# Patient Record
Sex: Female | Born: 1940 | Race: White | Hispanic: No | Marital: Single | Smoking: Former smoker
Health system: Southern US, Community
[De-identification: ages and names within clinical notes are randomized; demographics above are authoritative.]

## PROBLEM LIST (undated history)

## (undated) DIAGNOSIS — E538 Deficiency of other specified B group vitamins: Secondary | ICD-10-CM

## (undated) DIAGNOSIS — I10 Essential (primary) hypertension: Secondary | ICD-10-CM

## (undated) DIAGNOSIS — E785 Hyperlipidemia, unspecified: Secondary | ICD-10-CM

## (undated) DIAGNOSIS — H269 Unspecified cataract: Secondary | ICD-10-CM

## (undated) DIAGNOSIS — E119 Type 2 diabetes mellitus without complications: Secondary | ICD-10-CM

## (undated) HISTORY — PX: CATARACT EXTRACTION: SUR2

## (undated) HISTORY — PX: COLONOSCOPY: SHX174

## (undated) HISTORY — PX: EYE SURGERY: SHX253

---

## 2004-11-27 ENCOUNTER — Ambulatory Visit: Payer: Self-pay | Admitting: Family Medicine

## 2006-10-25 ENCOUNTER — Emergency Department: Payer: Self-pay | Admitting: Emergency Medicine

## 2008-04-27 ENCOUNTER — Ambulatory Visit: Payer: Self-pay | Admitting: Ophthalmology

## 2008-04-27 ENCOUNTER — Other Ambulatory Visit: Payer: Self-pay

## 2008-05-11 ENCOUNTER — Ambulatory Visit: Payer: Self-pay | Admitting: Ophthalmology

## 2010-11-29 ENCOUNTER — Ambulatory Visit: Payer: Self-pay | Admitting: Family Medicine

## 2011-03-23 ENCOUNTER — Ambulatory Visit: Payer: Self-pay | Admitting: Physician Assistant

## 2011-04-12 ENCOUNTER — Emergency Department: Payer: Self-pay | Admitting: Internal Medicine

## 2011-04-18 ENCOUNTER — Encounter (HOSPITAL_COMMUNITY)
Admission: RE | Admit: 2011-04-18 | Discharge: 2011-04-18 | Disposition: A | Discharge status: Home / Self Care | Payer: Medicare Other | Source: Ambulatory Visit | Attending: Neurosurgery | Admitting: Neurosurgery

## 2011-04-18 LAB — BASIC METABOLIC PANEL
CO2: 30 mEq/L (ref 19–32)
Calcium: 9.2 mg/dL (ref 8.4–10.5)
Chloride: 105 mEq/L (ref 96–112)
Creatinine, Ser: 0.77 mg/dL (ref 0.50–1.10)
Glucose, Bld: 108 mg/dL — ABNORMAL HIGH (ref 70–99)
Sodium: 141 mEq/L (ref 135–145)

## 2011-04-18 LAB — SURGICAL PCR SCREEN: Staphylococcus aureus: NEGATIVE

## 2011-04-18 LAB — CBC
Hemoglobin: 11.9 g/dL — ABNORMAL LOW (ref 12.0–15.0)
MCH: 28.9 pg (ref 26.0–34.0)
MCV: 88.8 fL (ref 78.0–100.0)
RBC: 4.12 MIL/uL (ref 3.87–5.11)

## 2011-04-20 ENCOUNTER — Inpatient Hospital Stay (HOSPITAL_COMMUNITY)
Admission: RE | Admit: 2011-04-20 | Discharge: 2011-04-21 | DRG: 473 | Disposition: A | Discharge status: Home / Self Care | Payer: Medicare Other | Source: Ambulatory Visit | Attending: Neurosurgery | Admitting: Neurosurgery

## 2011-04-20 DIAGNOSIS — F329 Major depressive disorder, single episode, unspecified: Secondary | ICD-10-CM | POA: Diagnosis present

## 2011-04-20 DIAGNOSIS — M129 Arthropathy, unspecified: Secondary | ICD-10-CM | POA: Diagnosis present

## 2011-04-20 DIAGNOSIS — Z01812 Encounter for preprocedural laboratory examination: Secondary | ICD-10-CM

## 2011-04-20 DIAGNOSIS — E119 Type 2 diabetes mellitus without complications: Secondary | ICD-10-CM | POA: Diagnosis present

## 2011-04-20 DIAGNOSIS — M5020 Other cervical disc displacement, unspecified cervical region: Principal | ICD-10-CM | POA: Diagnosis present

## 2011-04-20 DIAGNOSIS — I10 Essential (primary) hypertension: Secondary | ICD-10-CM | POA: Diagnosis present

## 2011-04-20 DIAGNOSIS — F3289 Other specified depressive episodes: Secondary | ICD-10-CM | POA: Diagnosis present

## 2011-04-20 LAB — GLUCOSE, CAPILLARY: Glucose-Capillary: 125 mg/dL — ABNORMAL HIGH (ref 70–99)

## 2011-04-21 LAB — GLUCOSE, CAPILLARY

## 2011-04-26 NOTE — Discharge Summary (Signed)
  April [name], April [name]                 ACCOUNT NO.:  [id number]  MEDICAL RECORD NO.:  [id number]  LOCATION:  3528                         FACILITY:  [facility]  PHYSICIAN:  Stefani Dama, M.D.  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  DISCHARGE AND FINAL DIAGNOSES: 1. C6 radiculopathy secondary to herniated nucleus pulposus, C5-C6. 2. Diabetes mellitus.  CONDITION ON DISCHARGE:  Improving.  HOSPITAL COURSE:  [name] is a 70 year old individual who had severe cervical radiculopathy that was unresponsive to conservative management.  On April 20, 2011, she underwent anterior cervical decompression and fusion at C5-C6 with anterior plate fixation. Postoperatively, the patient has had minimal difficulty with swallowing. Her diabetes has been readily manageable, and she is discharged home on her home medications.  In addition, she is given a prescription for Percocet #60 without refills, Flexeril 10 mg #60 also without refills to be used for pain control.  Her incision is clean and dry.  Her neurologic function is within limits of normal, and her condition on discharge is improving.  The patient will be discharged at the current time for followup as an outpatient.     Stefani Dama, M.D.     Merla Riches  D:  04/21/2011  T:  04/21/2011  Job:  [id number]  Electronically Signed by Barnett Abu M.D. on 04/26/2011 05:24:00 PM

## 2011-05-01 ENCOUNTER — Ambulatory Visit: Payer: Self-pay | Admitting: Family Medicine

## 2011-05-11 NOTE — Op Note (Signed)
NAMEJAYLENN, April [name]                 ACCOUNT NO.:  [id number]  MEDICAL RECORD NO.:  [id number]  LOCATION:  3528                         FACILITY:  [facility]  PHYSICIAN:  Donalee Citrin, M.D.        DATE OF BIRTH:  [birth year]  DATE OF PROCEDURE:  04/20/2011 DATE OF DISCHARGE:                              OPERATIVE REPORT   PREOPERATIVE DIAGNOSIS:  Left C6 radiculopathy from ruptured disk at C5- C6.  PROCEDURE:  Intracervical diskectomy and fusion at C5-C6 using Globus PEEK cage and Globus Addition plating system.  SURGEON:  Donalee Citrin, MD  ANESTHESIA:  General endotracheal.  HISTORY OF PRESENT ILLNESS:  The patient is a very pleasant 70 year old female who has had progressive worsening neck and left arm pain with weakness in her biceps, triceps, and numbness and tingling in his C6 distribution.  MRI scan showed an acute disk herniation with a free fragment disk space in the left C6 nerve root.  The patient failed all forms of conservative treatment and due to her clinical exam and MRI findings, he was recommended anterior cervical diskectomy and fusion.  I went over the risks and benefits of the operation to her.  She understands and agreed to proceed forward.  PROCEDURE IN DETAIL:  The patient was brought to the OR, was induced under general anesthesia, positioned supine with the neck in slight extension and 5 pounds of Halter traction.  The right side of her neck was prepped and draped in usual sterile fashion.  After preoperative x- ray localized the appropriate level, a curvilinear incision was made just off the midline to the anterior border of the sternocleidomastoid. The superficial layer of the platysma was divided out and dissected longitudinally.  The avascular plane between the sternocleidomastoid and strap muscles was developed down to the prevertebral fascia. Prevertebral fascia was dissected with Kittners.  Intraoperative x-ray identified the disk space above this at  C4-C5, so annulotomy was made one disk space below at C5-C6.  Large anterior osteophytes were bitten off with a 3-mm Kerrison punch.  Disk space was drilled down, capturing the bone shavings in mucus trap and at this point, the operative microscope was draped, brought onto the field, and under microscopic illumination, the undersurface of both endplates was underbitten.  The PLL was identified and was removed in piecemeal fashion.  Then marching across the left side with a black nerve hook, several large fragments of disk were removed from the C6 foramen.  Further underbiting of both endplates and the uncinate allowed identification of the C6 pedicle and the C6 nerve root was then further identified, skeletonized, flushed with pedicle after all the additional fragments had been removed from the foramen.  Then, the right side was decompressed.  After adequate decompression had been achieved both centrally and biforaminally, endplates were scraped.  An 89-mm PEEK cage was selected, packed with autograft mixed with Actifuse and then a 40-mm Globus plate was placed. All screws had excellent purchase.  Locking mechanisms were engaged. The wound was then copiously irrigated.  Meticulous hemostasis was maintained.  The platysma was then reapproximated with interrupted Vicryl and the skin was closed with running 4-0 subcuticular.  Benzoin  and Steri-Strips were applied.  The patient went to recovery room in stable condition.  At the end of the case, sponge and instruments count were correct.          ______________________________ Donalee Citrin, M.D.     GC/MEDQ  D:  04/20/2011  T:  04/21/2011  Job:  [id number]  Electronically Signed by Donalee Citrin M.D. on 05/11/2011 07:25:09 AM

## 2011-05-17 ENCOUNTER — Other Ambulatory Visit (HOSPITAL_COMMUNITY): Payer: Self-pay | Admitting: Neurosurgery

## 2011-05-17 ENCOUNTER — Ambulatory Visit (HOSPITAL_COMMUNITY)
Admission: RE | Admit: 2011-05-17 | Discharge: 2011-05-17 | Disposition: A | Discharge status: Home / Self Care | Payer: Medicare Other | Source: Ambulatory Visit | Attending: Neurosurgery | Admitting: Neurosurgery

## 2011-05-17 DIAGNOSIS — M542 Cervicalgia: Secondary | ICD-10-CM | POA: Insufficient documentation

## 2011-05-17 DIAGNOSIS — R52 Pain, unspecified: Secondary | ICD-10-CM

## 2011-05-17 DIAGNOSIS — Z981 Arthrodesis status: Secondary | ICD-10-CM | POA: Insufficient documentation

## 2011-05-29 ENCOUNTER — Ambulatory Visit
Admission: RE | Admit: 2011-05-29 | Discharge: 2011-05-29 | Disposition: A | Discharge status: Home / Self Care | Payer: Medicare Other | Source: Ambulatory Visit | Attending: Neurosurgery | Admitting: Neurosurgery

## 2011-05-29 ENCOUNTER — Other Ambulatory Visit: Payer: Self-pay | Admitting: Neurosurgery

## 2011-05-29 DIAGNOSIS — M542 Cervicalgia: Secondary | ICD-10-CM

## 2011-05-29 DIAGNOSIS — M5030 Other cervical disc degeneration, unspecified cervical region: Secondary | ICD-10-CM

## 2011-05-29 DIAGNOSIS — M79609 Pain in unspecified limb: Secondary | ICD-10-CM

## 2011-07-12 ENCOUNTER — Ambulatory Visit
Admission: RE | Admit: 2011-07-12 | Discharge: 2011-07-12 | Disposition: A | Discharge status: Home / Self Care | Payer: Medicare Other | Source: Ambulatory Visit | Attending: Neurosurgery | Admitting: Neurosurgery

## 2011-07-12 ENCOUNTER — Other Ambulatory Visit: Payer: Self-pay | Admitting: Neurosurgery

## 2011-07-12 DIAGNOSIS — M542 Cervicalgia: Secondary | ICD-10-CM

## 2011-07-19 IMAGING — CT CT CHEST W/ CM
1 series · 16 of 31 positions shown, 20 images · IV contrast (agent unspecified)
Comparison: none

REASON FOR EXAM: lung nodule  seen on Xray  takes Metformin
COMMENTS:

PROCEDURE:     CT  - CT CHEST WITH CONTRAST  - May 01, 2011  [DATE]
RESULT:
HISTORY: Lung nodule.

[Series 2: soft tissue · axial · 0.70mm/px · z∈[-692,-412]mm · 16 of 62 slices shown, 20 images]
[im 3/62  mediastinal]
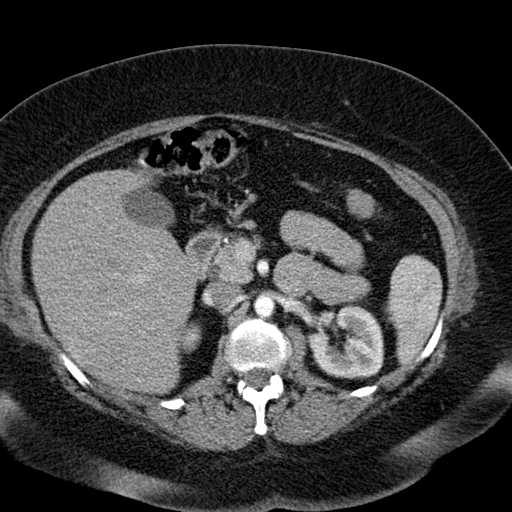
[im 3/62  lung]
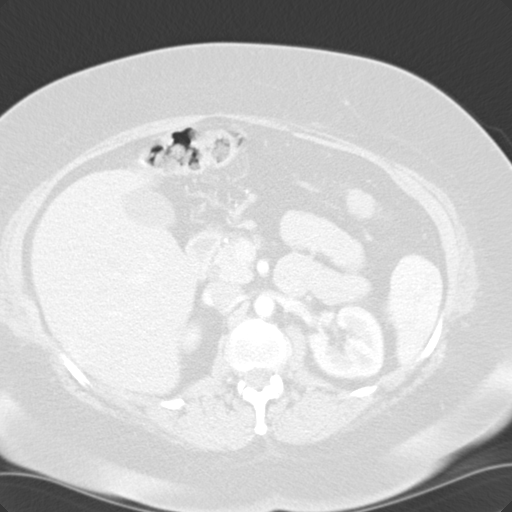
[im 7/62  lung]
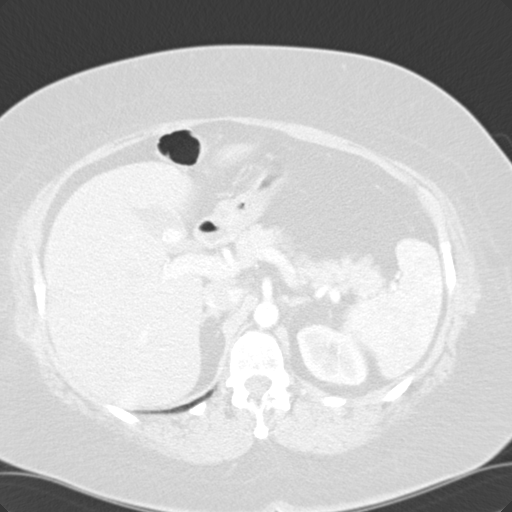
[im 12/62  lung]
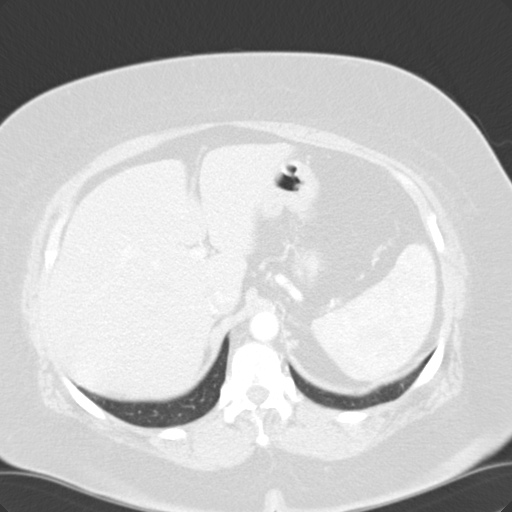
[im 14/62  lung]
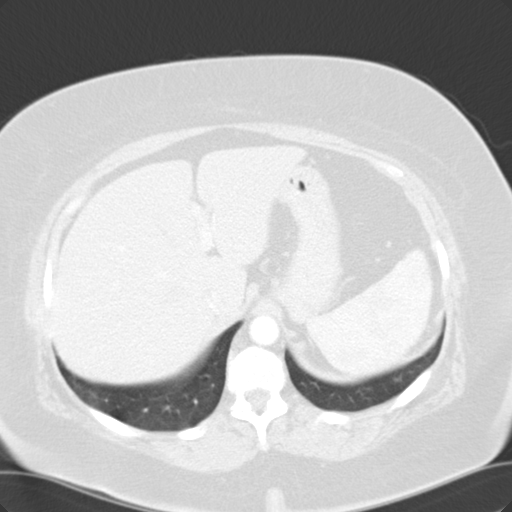
[im 19/62  mediastinal]
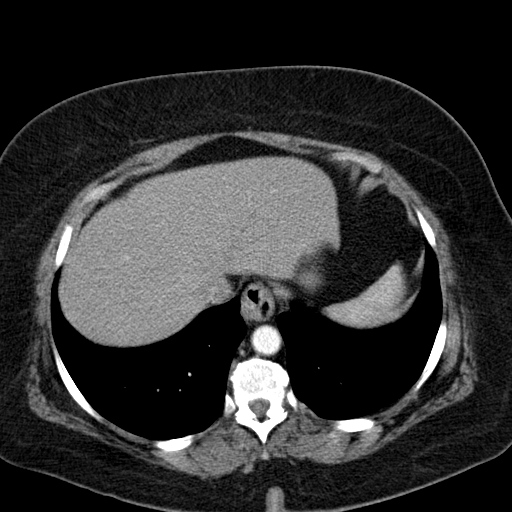
[im 19/62  lung]
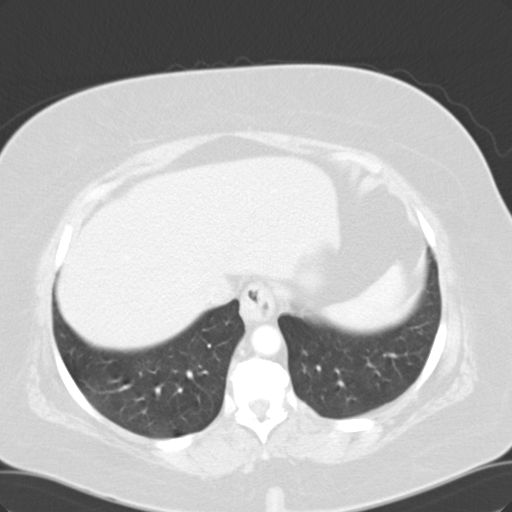
[im 21/62  lung]
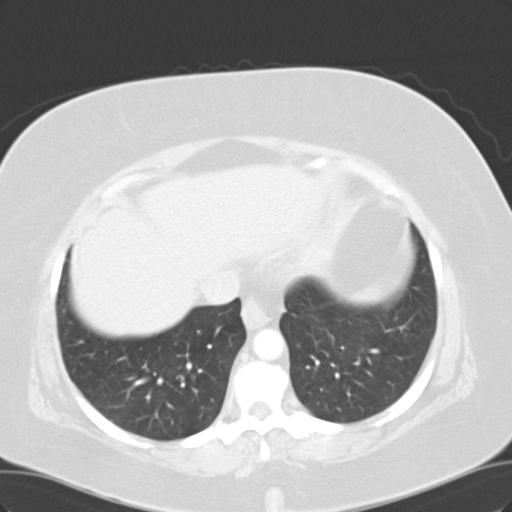
[im 25/62  lung]
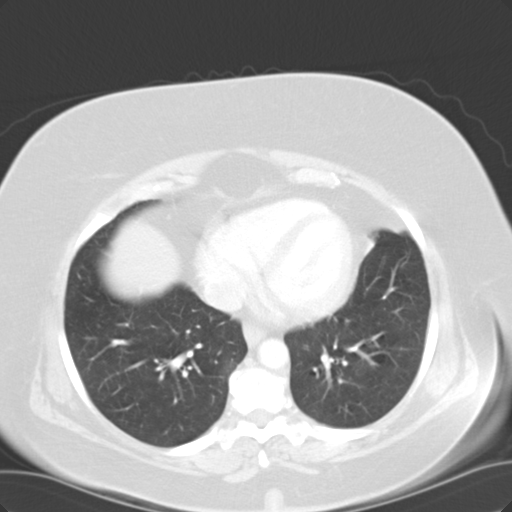
[im 30/62  lung]
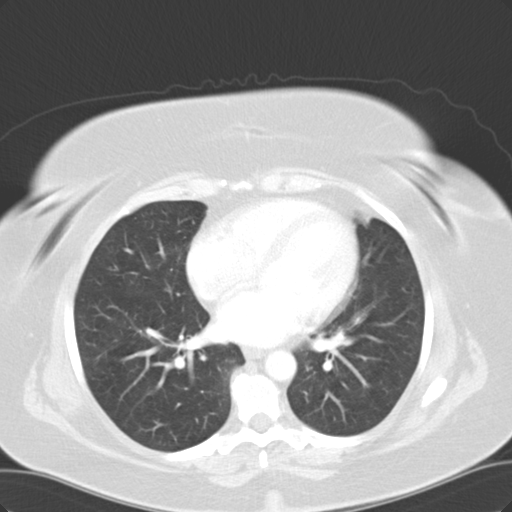
[im 33/62  mediastinal]
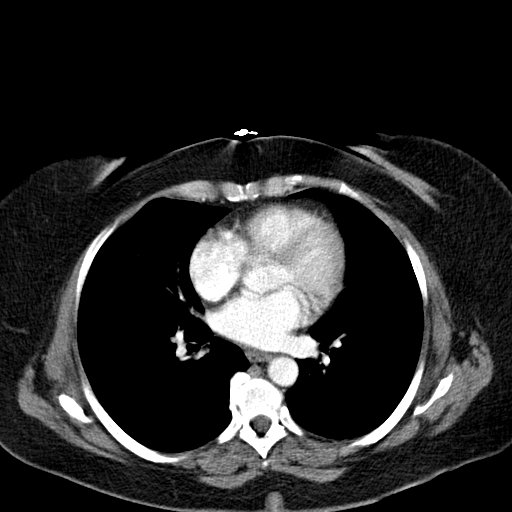
[im 33/62  lung]
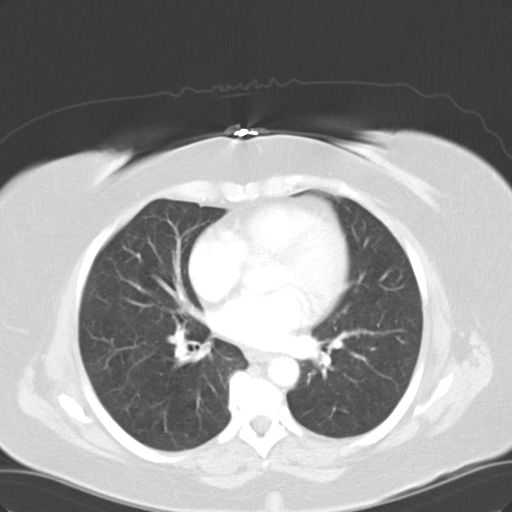
[im 37/62  lung]
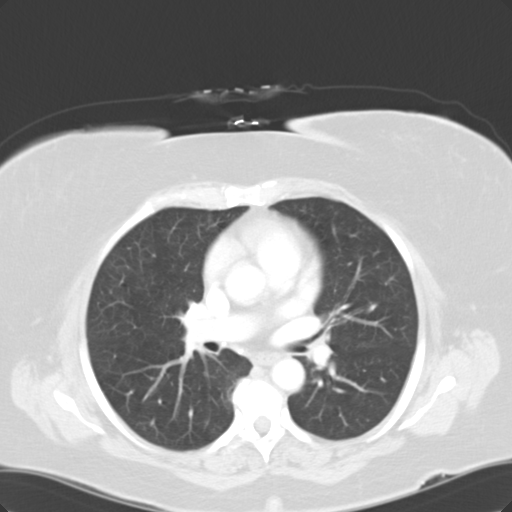
[im 41/62  lung]
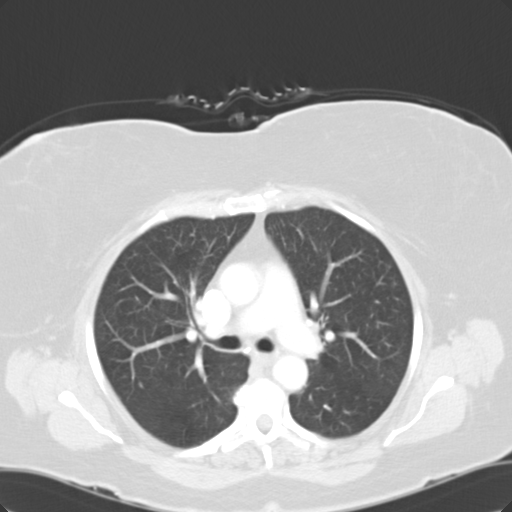
[im 43/62  lung]
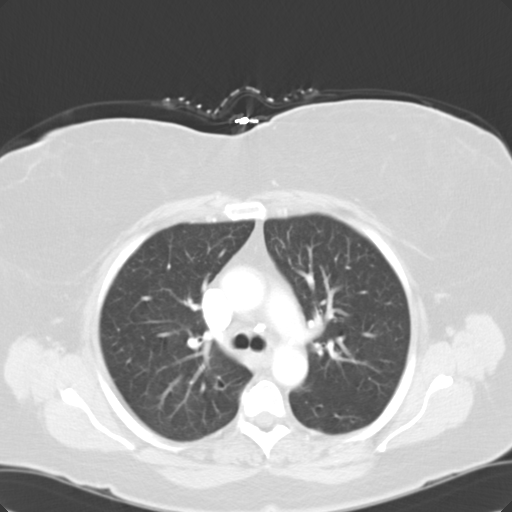
[im 48/62  mediastinal]
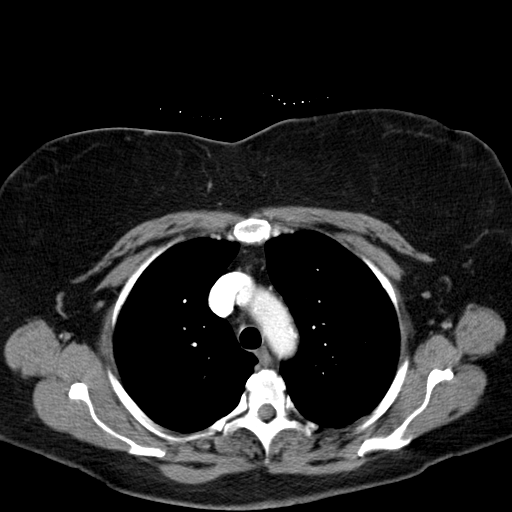
[im 48/62  lung]
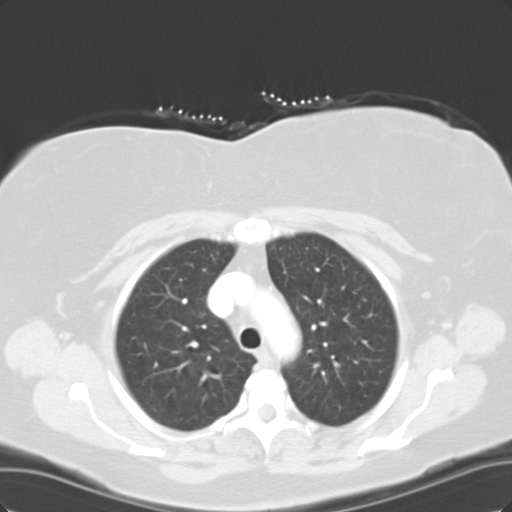
[im 50/62  lung]
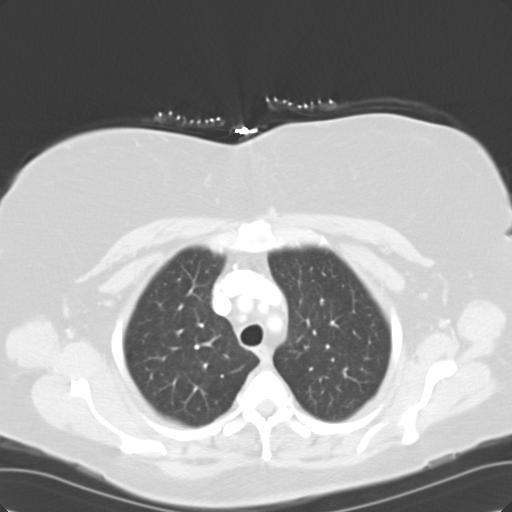
[im 55/62  lung]
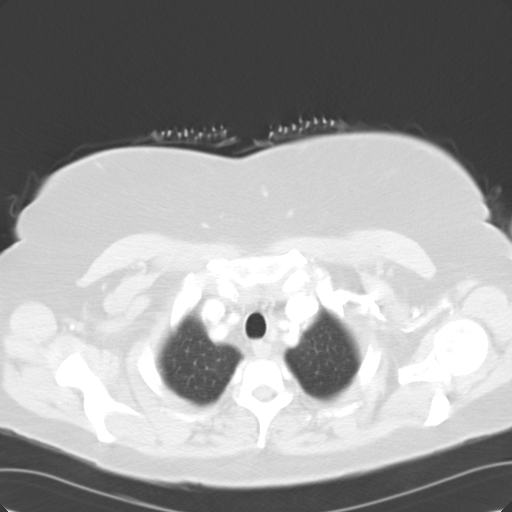
[im 59/62  lung]
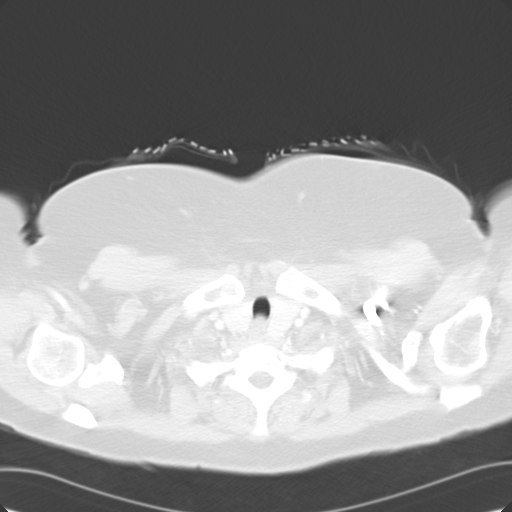

[16 of 31 positions shown; findings below may reference images not displayed]

FINDINGS: Standard contrast enhanced CT scan obtained with 75 ml of
Ysovue-DBF. Mitral annular calcification is present. Calcified pulmonary
nodule noted in the right lung base most consistent with a granuloma. The
adrenals are normal. No pulmonary infiltrates noted. No acute bony
abnormality identified.
IMPRESSION: Calcified pulmonary nodule in the right lung base most
consistent with a granuloma.

## 2012-09-24 ENCOUNTER — Ambulatory Visit: Payer: Self-pay | Admitting: Family Medicine

## 2014-03-26 DIAGNOSIS — E119 Type 2 diabetes mellitus without complications: Secondary | ICD-10-CM

## 2014-03-26 DIAGNOSIS — I10 Essential (primary) hypertension: Secondary | ICD-10-CM | POA: Diagnosis present

## 2014-03-26 DIAGNOSIS — E7849 Other hyperlipidemia: Secondary | ICD-10-CM | POA: Diagnosis present

## 2015-04-06 ENCOUNTER — Other Ambulatory Visit: Payer: Self-pay | Admitting: Family Medicine

## 2015-04-06 DIAGNOSIS — Z1231 Encounter for screening mammogram for malignant neoplasm of breast: Secondary | ICD-10-CM

## 2015-04-20 ENCOUNTER — Other Ambulatory Visit: Payer: Self-pay | Admitting: Family Medicine

## 2015-04-20 ENCOUNTER — Ambulatory Visit
Admission: RE | Admit: 2015-04-20 | Discharge: 2015-04-20 | Disposition: A | Discharge status: Home / Self Care | Payer: Medicare Other | Source: Ambulatory Visit | Attending: Family Medicine | Admitting: Family Medicine

## 2015-04-20 DIAGNOSIS — Z1231 Encounter for screening mammogram for malignant neoplasm of breast: Secondary | ICD-10-CM | POA: Diagnosis present

## 2015-07-01 ENCOUNTER — Encounter: Payer: Self-pay | Admitting: *Deleted

## 2015-07-02 DIAGNOSIS — I10 Essential (primary) hypertension: Secondary | ICD-10-CM | POA: Diagnosis not present

## 2015-07-02 DIAGNOSIS — Z800 Family history of malignant neoplasm of digestive organs: Secondary | ICD-10-CM | POA: Diagnosis not present

## 2015-07-02 DIAGNOSIS — E119 Type 2 diabetes mellitus without complications: Secondary | ICD-10-CM | POA: Diagnosis not present

## 2015-07-02 DIAGNOSIS — K641 Second degree hemorrhoids: Secondary | ICD-10-CM | POA: Diagnosis not present

## 2015-07-02 DIAGNOSIS — K644 Residual hemorrhoidal skin tags: Secondary | ICD-10-CM | POA: Diagnosis not present

## 2015-07-02 DIAGNOSIS — Z87891 Personal history of nicotine dependence: Secondary | ICD-10-CM | POA: Diagnosis not present

## 2015-07-02 DIAGNOSIS — Z888 Allergy status to other drugs, medicaments and biological substances status: Secondary | ICD-10-CM | POA: Diagnosis not present

## 2015-07-02 DIAGNOSIS — Z882 Allergy status to sulfonamides status: Secondary | ICD-10-CM | POA: Diagnosis not present

## 2015-07-02 DIAGNOSIS — E538 Deficiency of other specified B group vitamins: Secondary | ICD-10-CM | POA: Diagnosis not present

## 2015-07-02 DIAGNOSIS — K5730 Diverticulosis of large intestine without perforation or abscess without bleeding: Secondary | ICD-10-CM | POA: Diagnosis not present

## 2015-07-02 DIAGNOSIS — E785 Hyperlipidemia, unspecified: Secondary | ICD-10-CM | POA: Diagnosis not present

## 2015-07-02 DIAGNOSIS — D1779 Benign lipomatous neoplasm of other sites: Secondary | ICD-10-CM | POA: Diagnosis not present

## 2015-07-02 DIAGNOSIS — Z1211 Encounter for screening for malignant neoplasm of colon: Secondary | ICD-10-CM | POA: Diagnosis not present

## 2015-07-04 ENCOUNTER — Encounter
Admission: RE | Disposition: A | Discharge status: Home / Self Care | Payer: Self-pay | Source: Ambulatory Visit | Attending: Gastroenterology

## 2015-07-04 ENCOUNTER — Ambulatory Visit
Admission: RE | Admit: 2015-07-04 | Discharge: 2015-07-04 | Disposition: A | Discharge status: Home / Self Care | Payer: Medicare Other | Source: Ambulatory Visit | Attending: Gastroenterology | Admitting: Gastroenterology

## 2015-07-04 ENCOUNTER — Encounter: Payer: Self-pay | Admitting: Anesthesiology

## 2015-07-04 ENCOUNTER — Ambulatory Visit: Payer: Medicare Other | Admitting: Anesthesiology

## 2015-07-04 DIAGNOSIS — Z888 Allergy status to other drugs, medicaments and biological substances status: Secondary | ICD-10-CM | POA: Insufficient documentation

## 2015-07-04 DIAGNOSIS — E538 Deficiency of other specified B group vitamins: Secondary | ICD-10-CM | POA: Insufficient documentation

## 2015-07-04 DIAGNOSIS — E119 Type 2 diabetes mellitus without complications: Secondary | ICD-10-CM | POA: Insufficient documentation

## 2015-07-04 DIAGNOSIS — D1779 Benign lipomatous neoplasm of other sites: Secondary | ICD-10-CM | POA: Insufficient documentation

## 2015-07-04 DIAGNOSIS — K5730 Diverticulosis of large intestine without perforation or abscess without bleeding: Secondary | ICD-10-CM | POA: Insufficient documentation

## 2015-07-04 DIAGNOSIS — Z1211 Encounter for screening for malignant neoplasm of colon: Secondary | ICD-10-CM | POA: Insufficient documentation

## 2015-07-04 DIAGNOSIS — Z882 Allergy status to sulfonamides status: Secondary | ICD-10-CM | POA: Insufficient documentation

## 2015-07-04 DIAGNOSIS — I10 Essential (primary) hypertension: Secondary | ICD-10-CM | POA: Insufficient documentation

## 2015-07-04 DIAGNOSIS — K644 Residual hemorrhoidal skin tags: Secondary | ICD-10-CM | POA: Insufficient documentation

## 2015-07-04 DIAGNOSIS — E785 Hyperlipidemia, unspecified: Secondary | ICD-10-CM | POA: Insufficient documentation

## 2015-07-04 DIAGNOSIS — Z800 Family history of malignant neoplasm of digestive organs: Secondary | ICD-10-CM | POA: Insufficient documentation

## 2015-07-04 DIAGNOSIS — K641 Second degree hemorrhoids: Secondary | ICD-10-CM | POA: Insufficient documentation

## 2015-07-04 DIAGNOSIS — Z87891 Personal history of nicotine dependence: Secondary | ICD-10-CM | POA: Insufficient documentation

## 2015-07-04 HISTORY — DX: Deficiency of other specified B group vitamins: E53.8

## 2015-07-04 HISTORY — DX: Essential (primary) hypertension: I10

## 2015-07-04 HISTORY — DX: Unspecified cataract: H26.9

## 2015-07-04 HISTORY — DX: Hyperlipidemia, unspecified: E78.5

## 2015-07-04 HISTORY — PX: COLONOSCOPY WITH PROPOFOL: SHX5780

## 2015-07-04 HISTORY — DX: Type 2 diabetes mellitus without complications: E11.9

## 2015-07-04 LAB — GLUCOSE, CAPILLARY: GLUCOSE-CAPILLARY: 223 mg/dL — AB (ref 65–99)

## 2015-07-04 SURGERY — COLONOSCOPY WITH PROPOFOL
Anesthesia: General

## 2015-07-04 MED ORDER — SODIUM CHLORIDE 0.9 % IV SOLN: INTRAVENOUS | Status: DC

## 2015-07-04 MED ORDER — LIDOCAINE HCL (CARDIAC) 20 MG/ML IV SOLN
INTRAVENOUS | Status: DC | PRN
  Administered 2015-07-04: 100 mg via INTRAVENOUS

## 2015-07-04 MED ORDER — PROPOFOL 10 MG/ML IV BOLUS
INTRAVENOUS | Status: DC | PRN
  Administered 2015-07-04: 80 mg via INTRAVENOUS

## 2015-07-04 MED ORDER — SODIUM CHLORIDE 0.9 % IV SOLN
INTRAVENOUS | Status: DC
  Administered 2015-07-04: 1000 mL via INTRAVENOUS

## 2015-07-04 MED ORDER — PROPOFOL INFUSION 10 MG/ML OPTIME
INTRAVENOUS | Status: DC | PRN
  Administered 2015-07-04: 120 ug/kg/min via INTRAVENOUS

## 2015-07-04 NOTE — H&P (Signed)
  Primary Care Physician:  Maryland Pink, MD  Pre-Procedure History & Physical: HPI:  April [name] is a 74 y.o. female is here for an colonoscopy.   Past Medical History  Diagnosis Date  . Diabetes mellitus without complication   . Cataracts, both eyes   . Dyslipidemia   . B12 deficiency   . Hypertension     Past Surgical History  Procedure Laterality Date  . Colonoscopy    . Eye surgery    . Cataract extraction      Prior to Admission medications   Medication Sig Start Date End Date Taking? Authorizing Provider  glipiZIDE (GLUCOTROL) 10 MG tablet Take 10 mg by mouth daily before breakfast.   Yes Historical Provider, MD  lisinopril (PRINIVIL,ZESTRIL) 5 MG tablet Take 5 mg by mouth daily.   Yes Historical Provider, MD  meloxicam (MOBIC) 15 MG tablet Take 15 mg by mouth daily.   Yes Historical Provider, MD  metFORMIN (GLUCOPHAGE) 500 MG tablet Take by mouth 2 (two) times daily with a meal.   Yes Historical Provider, MD  pravastatin (PRAVACHOL) 80 MG tablet Take 80 mg by mouth daily.   Yes Historical Provider, MD  sertraline (ZOLOFT) 50 MG tablet Take 50 mg by mouth daily.   Yes Historical Provider, MD  SitaGLIPtin-MetFORMIN HCl 50-500 MG TB24 Take by mouth.   Yes Historical Provider, MD    Allergies as of 06/14/2015  . (Not on File)    Family History  Problem Relation Age of Onset  . Breast cancer Sister     Social History   Social History  . Marital Status: Single    Spouse Name: N/A  . Number of Children: N/A  . Years of Education: N/A   Occupational History  . Not on file.   Social History Main Topics  . Smoking status: Former Research scientist (life sciences)  . Smokeless tobacco: Not on file  . Alcohol Use: Not on file  . Drug Use: Not on file  . Sexual Activity: Not on file   Other Topics Concern  . Not on file   Social History Narrative     Physical Exam: BP 143/61 mmHg  Pulse 82  Temp(Src) 98.3 F (36.8 C) (Tympanic)  Resp 16  Ht 5\' 10"  (1.778 m)  Wt 117.935 kg  (260 lb)  BMI 37.31 kg/m2  SpO2 99% General:   Alert,  pleasant and cooperative in NAD Head:  Normocephalic and atraumatic. Neck:  Supple; no masses or thyromegaly. Lungs:  Clear throughout to auscultation.    Heart:  Regular rate and rhythm. Abdomen:  Soft, nontender and nondistended. Normal bowel sounds, without guarding, and without rebound.   Neurologic:  Alert and  oriented x4;  grossly normal neurologically.  Impression/Plan: April [name] is here for an colonoscopy to be performed for screening, avg risk  Risks, benefits, limitations, and alternatives regarding  colonoscopy have been reviewed with the patient.  Questions have been answered.  All parties agreeable.   Josefine Class, MD  07/04/2015, 8:44 AM

## 2015-07-04 NOTE — Discharge Instructions (Signed)

## 2015-07-04 NOTE — Anesthesia Postprocedure Evaluation (Signed)
  Anesthesia Post-op Note  Patient: April [name]  Procedure(s) Performed: Procedure(s): COLONOSCOPY WITH PROPOFOL (N/A)  Anesthesia type:General  Patient location: PACU  Post pain: Pain level controlled  Post assessment: Post-op Vital signs reviewed, Patient's Cardiovascular Status Stable, Respiratory Function Stable, Patent Airway and No signs of Nausea or vomiting  Post vital signs: Reviewed and stable  Last Vitals:  Filed Vitals:   07/04/15 0942  BP: 142/64  Pulse: 75  Temp:   Resp: 22    Level of consciousness: awake, alert  and patient cooperative  Complications: No apparent anesthesia complications

## 2015-07-04 NOTE — Op Note (Signed)
[facility] Gastroenterology Patient Name: April [name] Procedure Date: 07/04/2015 8:39 AM MRN: [id number] Account #: [id number] Date of Birth: [birth year] Admit Type: Outpatient Age: 74 Room: [facility] ENDO ROOM 2 Gender: Female Note Status: Finalized Procedure:         Colonoscopy Indications:       Colon cancer screening in patient at increased risk:                     Colorectal cancer in multiple 2nd degree relatives, This                     is the patient's first colonoscopy Patient Profile:   This is a 74 year old female. Providers:         Gerrit Heck. Rayann Heman, MD Referring MD:      Irven Easterly. Kary Kos, MD (Referring MD) Medicines:         Propofol per Anesthesia Complications:     No immediate complications. Procedure:         Pre-Anesthesia Assessment:                    - Prior to the procedure, a History and Physical was                     performed, and patient medications, allergies and                     sensitivities were reviewed. The patient's tolerance of                     previous anesthesia was reviewed.                    After obtaining informed consent, the colonoscope was                     passed under direct vision. Throughout the procedure, the                     patient's blood pressure, pulse, and oxygen saturations                     were monitored continuously. The Colonoscope was                     introduced through the anus and advanced to the the cecum,                     identified by appendiceal orifice and ileocecal valve. The                     colonoscopy was performed without difficulty. The patient                     tolerated the procedure well. The quality of the bowel                     preparation was excellent. Findings:      The perianal exam findings include non-thrombosed external hemorrhoids.      There was a medium-sized lipoma, in the proximal transverse colon.       Pillow sign was positive.      Many small  and large-mouthed diverticula were found in the sigmoid colon.      Internal hemorrhoids were found during  retroflexion. The hemorrhoids       were Grade II (internal hemorrhoids that prolapse but reduce       spontaneously).      The exam was otherwise without abnormality. Impression:        - Non-thrombosed external hemorrhoids found on perianal                     exam.                    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                    - Diverticulosis in the sigmoid colon.                    - Internal hemorrhoids.                    - The examination was otherwise normal.                    - No specimens collected. Recommendation:    - Observe patient in GI recovery unit.                    - High fiber diet.                    - Continue present medications.                    - Repeat colonoscopy in 5 years for screening purposes.                    - The findings and recommendations were discussed with the                     patient.                    - The findings and recommendations were discussed with the                     patient's family. Procedure Code(s): --- Professional ---                    [phone], Colonoscopy, flexible; diagnostic, including                     collection of specimen(s) by brushing or washing, when                     performed (separate procedure) CPT copyright 2014 American Medical Association. All rights reserved. The codes documented in this report are preliminary and upon coder review may  be revised to meet current compliance requirements. Mellody Life, MD 07/04/2015 9:12:11 AM This report has been signed electronically. Number of Addenda: 0 Note Initiated On: 07/04/2015 8:39 AM Scope Withdrawal Time: 0 hours 9 minutes 59 seconds  Total Procedure Duration: 0 hours 13 minutes 35 seconds       [facility]

## 2015-07-04 NOTE — Anesthesia Preprocedure Evaluation (Signed)
Anesthesia Evaluation  Patient identified by MRN, date of birth, ID band Patient awake    Reviewed: Allergy & Precautions, H&P , NPO status , Patient's Chart, lab work & pertinent test results  Airway Mallampati: III  TM Distance: >3 FB Neck ROM: limited    Dental  (+) Poor Dentition, Chipped, Missing   Pulmonary former smoker,    Pulmonary exam normal breath sounds clear to auscultation       Cardiovascular Exercise Tolerance: Good hypertension, (-) Past MI Normal cardiovascular exam Rhythm:regular Rate:Normal     Neuro/Psych negative neurological ROS  negative psych ROS   GI/Hepatic negative GI ROS, Neg liver ROS,   Endo/Other  diabetes, Type 2, Oral Hypoglycemic Agents  Renal/GU negative Renal ROS  negative genitourinary   Musculoskeletal   Abdominal   Peds  Hematology negative hematology ROS (+)   Anesthesia Other Findings Past Medical History:   Diabetes mellitus without complication                       Cataracts, both eyes                                         Dyslipidemia                                                 B12 deficiency                                               Hypertension                                                 Signs and symptoms suggestive of sleep apnea    Reproductive/Obstetrics negative OB ROS                             Anesthesia Physical Anesthesia Plan  ASA: III  Anesthesia Plan: General   Post-op Pain Management:    Induction:   Airway Management Planned:   Additional Equipment:   Intra-op Plan:   Post-operative Plan:   Informed Consent: I have reviewed the patients History and Physical, chart, labs and discussed the procedure including the risks, benefits and alternatives for the proposed anesthesia with the patient or authorized representative who has indicated his/her understanding and acceptance.   Dental Advisory  Given  Plan Discussed with: Anesthesiologist, CRNA and Surgeon  Anesthesia Plan Comments:         Anesthesia Quick Evaluation

## 2015-07-04 NOTE — Transfer of Care (Signed)
Immediate Anesthesia Transfer of Care Note  Patient: April [name]  Procedure(s) Performed: Procedure(s): COLONOSCOPY WITH PROPOFOL (N/A)  Patient Location: Endoscopy Unit  Anesthesia Type:General  Level of Consciousness: sedated  Airway & Oxygen Therapy: Patient Spontanous Breathing and Patient connected to nasal cannula oxygen  Post-op Assessment: Report given to RN and Post -op Vital signs reviewed and stable  Post vital signs: Reviewed and stable  Last Vitals:  Filed Vitals:   07/04/15 0831  BP: 143/61  Pulse: 82  Temp: 36.8 C  Resp: 16    Complications: No apparent anesthesia complications

## 2015-07-05 ENCOUNTER — Encounter: Payer: Self-pay | Admitting: Gastroenterology

## 2015-12-17 ENCOUNTER — Emergency Department
Admission: EM | Admit: 2015-12-17 | Discharge: 2015-12-17 | Disposition: A | Discharge status: Home / Self Care | Payer: Medicare HMO | Attending: Emergency Medicine | Admitting: Emergency Medicine

## 2015-12-17 ENCOUNTER — Encounter: Payer: Self-pay | Admitting: *Deleted

## 2015-12-17 DIAGNOSIS — S61451A Open bite of right hand, initial encounter: Secondary | ICD-10-CM | POA: Insufficient documentation

## 2015-12-17 DIAGNOSIS — E119 Type 2 diabetes mellitus without complications: Secondary | ICD-10-CM | POA: Diagnosis not present

## 2015-12-17 DIAGNOSIS — Z79899 Other long term (current) drug therapy: Secondary | ICD-10-CM | POA: Diagnosis not present

## 2015-12-17 DIAGNOSIS — I10 Essential (primary) hypertension: Secondary | ICD-10-CM | POA: Diagnosis not present

## 2015-12-17 DIAGNOSIS — L089 Local infection of the skin and subcutaneous tissue, unspecified: Secondary | ICD-10-CM | POA: Diagnosis not present

## 2015-12-17 DIAGNOSIS — Y9389 Activity, other specified: Secondary | ICD-10-CM | POA: Insufficient documentation

## 2015-12-17 DIAGNOSIS — W540XXA Bitten by dog, initial encounter: Secondary | ICD-10-CM | POA: Diagnosis not present

## 2015-12-17 DIAGNOSIS — Z7984 Long term (current) use of oral hypoglycemic drugs: Secondary | ICD-10-CM | POA: Diagnosis not present

## 2015-12-17 DIAGNOSIS — Y998 Other external cause status: Secondary | ICD-10-CM | POA: Insufficient documentation

## 2015-12-17 DIAGNOSIS — S61431A Puncture wound without foreign body of right hand, initial encounter: Secondary | ICD-10-CM | POA: Diagnosis not present

## 2015-12-17 DIAGNOSIS — Y9289 Other specified places as the place of occurrence of the external cause: Secondary | ICD-10-CM | POA: Diagnosis not present

## 2015-12-17 DIAGNOSIS — Z23 Encounter for immunization: Secondary | ICD-10-CM | POA: Insufficient documentation

## 2015-12-17 DIAGNOSIS — Z791 Long term (current) use of non-steroidal anti-inflammatories (NSAID): Secondary | ICD-10-CM | POA: Insufficient documentation

## 2015-12-17 DIAGNOSIS — Z87891 Personal history of nicotine dependence: Secondary | ICD-10-CM | POA: Insufficient documentation

## 2015-12-17 MED ORDER — TRAMADOL HCL 50 MG PO TABS: 50.0000 mg | ORAL_TABLET | Freq: Two times a day (BID) | ORAL | Status: DC | PRN

## 2015-12-17 MED ORDER — IBUPROFEN 200 MG PO TABS: 400.0000 mg | ORAL_TABLET | Freq: Three times a day (TID) | ORAL | Status: DC | PRN

## 2015-12-17 MED ORDER — AMOXICILLIN-POT CLAVULANATE 875-125 MG PO TABS: 1.0000 | ORAL_TABLET | Freq: Two times a day (BID) | ORAL | Status: DC

## 2015-12-17 MED ORDER — TRAMADOL HCL 50 MG PO TABS
50.0000 mg | ORAL_TABLET | Freq: Once | ORAL | Status: AC
Start: 2015-12-17 — End: 2015-12-17
  Administered 2015-12-17: 50 mg via ORAL
  Filled 2015-12-17: qty 1

## 2015-12-17 MED ORDER — IBUPROFEN 400 MG PO TABS
400.0000 mg | ORAL_TABLET | Freq: Once | ORAL | Status: AC
Start: 2015-12-17 — End: 2015-12-17
  Administered 2015-12-17: 400 mg via ORAL
  Filled 2015-12-17: qty 1

## 2015-12-17 MED ORDER — TETANUS-DIPHTH-ACELL PERTUSSIS 5-2.5-18.5 LF-MCG/0.5 IM SUSP
0.5000 mL | Freq: Once | INTRAMUSCULAR | Status: AC
Start: 2015-12-17 — End: 2015-12-17
  Administered 2015-12-17: 0.5 mL via INTRAMUSCULAR
  Filled 2015-12-17: qty 0.5

## 2015-12-17 MED ORDER — AMOXICILLIN-POT CLAVULANATE 875-125 MG PO TABS
1.0000 | ORAL_TABLET | Freq: Once | ORAL | Status: AC
  Administered 2015-12-17: 1 via ORAL
  Filled 2015-12-17: qty 1

## 2015-12-17 NOTE — Discharge Instructions (Signed)
Animal Bite Animal bite wounds can get infected. It is important to get proper medical treatment. Ask your doctor if you need rabies treatment. Follow-up with family doctor in 2 days. HOME CARE  Wound Care  Follow instructions from your doctor about how to take care of your wound. Make sure you:  Wash your hands with soap and water before you change your bandage (dressing). If you cannot use soap and water, use hand sanitizer.  Change your bandage as told by your doctor.  Leave stitches (sutures), skin glue, or skin tape (adhesive) strips in place. They may need to stay in place for 2 weeks or longer. If tape strips get loose and curl up, you may trim the loose edges. Do not remove tape strips completely unless your doctor says it is okay.  Check your wound every day for signs of infection. Watch for:    Redness, swelling, or pain that gets worse.    Fluid, blood, or pus.  General Instructions  Take or apply over-the-counter and prescription medicines only as told by your doctor.   If you were prescribed an antibiotic, take or apply it as told by your doctor. Do not stop using the antibiotic even if your condition improves.   Keep the injured area raised (elevated) above the level of your heart while you are sitting or lying down.  If directed, apply ice to the injured area.    Put ice in a plastic bag.    Place a towel between your skin and the bag.    Leave the ice on for 20 minutes, 2-3 times per day.   Keep all follow-up visits as told by your doctor. This is important.  GET HELP IF:  You have redness, swelling, or pain that gets worse.   You have a general feeling of sickness (malaise).   You feel sick to your stomach (nauseous).  You throw up (vomit).   You have pain that does not get better.  GET HELP RIGHT AWAY IF:   You have a red streak going away from your wound.   You have fluid, blood, or pus coming from your wound.   You have a  fever or chills.   You have trouble moving your injured area.   You have numbness or tingling anywhere on your body.    This information is not intended to replace advice given to you by your health care provider. Make sure you discuss any questions you have with your health care provider.   Document Released: 10/08/2005 Document Revised: 06/29/2015 Document Reviewed: 02/23/2015 Elsevier Interactive Patient Education 2016 [employer].  Conservator, museum/gallery bite wounds can get infected. It is important to get proper medical treatment. Ask your doctor if you need rabies treatment. HOME CARE  Wound Care  Follow instructions from your doctor about how to take care of your wound. Make sure you:  Wash your hands with soap and water before you change your bandage (dressing). If you cannot use soap and water, use hand sanitizer.  Change your bandage as told by your doctor.  Leave stitches (sutures), skin glue, or skin tape (adhesive) strips in place. They may need to stay in place for 2 weeks or longer. If tape strips get loose and curl up, you may trim the loose edges. Do not remove tape strips completely unless your doctor says it is okay.  Check your wound every day for signs of infection. Watch for:    Redness, swelling, or pain that  gets worse.    Fluid, blood, or pus.  General Instructions  Take or apply over-the-counter and prescription medicines only as told by your doctor.   If you were prescribed an antibiotic, take or apply it as told by your doctor. Do not stop using the antibiotic even if your condition improves.   Keep the injured area raised (elevated) above the level of your heart while you are sitting or lying down.  If directed, apply ice to the injured area.    Put ice in a plastic bag.    Place a towel between your skin and the bag.    Leave the ice on for 20 minutes, 2-3 times per day.   Keep all follow-up visits as told by your doctor. This  is important.  GET HELP IF:  You have redness, swelling, or pain that gets worse.   You have a general feeling of sickness (malaise).   You feel sick to your stomach (nauseous).  You throw up (vomit).   You have pain that does not get better.  GET HELP RIGHT AWAY IF:   You have a red streak going away from your wound.   You have fluid, blood, or pus coming from your wound.   You have a fever or chills.   You have trouble moving your injured area.   You have numbness or tingling anywhere on your body.    This information is not intended to replace advice given to you by your health care provider. Make sure you discuss any questions you have with your health care provider.   Document Released: 10/08/2005 Document Revised: 06/29/2015 Document Reviewed: 02/23/2015 Elsevier Interactive Patient Education [employer].

## 2015-12-17 NOTE — ED Provider Notes (Signed)
[facility] Emergency Department Provider Note  ____________________________________________  Time seen: Approximately 4:12 PM  I have reviewed the triage vital signs and the nursing notes.   HISTORY  Chief Complaint Animal Bite    HPI April [name] is a 75 y.o. female [facility] secondary to dog bite on the dorsal aspect of her right hand. He was bit by her own dog. Patient state dog immunizations up to date and police has been notified and interviewed the patient. Patient states she did not know the date of last tetanus shot. Patient complaining of redness and swelling. Patient also states she feels some paresthesias in her fingers 2 through 5. She denies any loss of function of the fingers. Except for Epsom Salt soak no palliative measures taken for this complaint. He rates the pain as a 3/10. Patient describes the pain as "achy".  Past Medical History  Diagnosis Date  . Diabetes mellitus without complication ([place])   . Cataracts, both eyes   . Dyslipidemia   . B12 deficiency   . Hypertension     There are no active problems to display for this patient.   Past Surgical History  Procedure Laterality Date  . Colonoscopy    . Eye surgery    . Cataract extraction    . Colonoscopy with propofol N/A 07/04/2015    Procedure: COLONOSCOPY WITH PROPOFOL;  Surgeon: Josefine Class, MD;  Location: [facility] ENDOSCOPY;  Service: Endoscopy;  Laterality: N/A;    Current Outpatient Rx  Name  Route  Sig  Dispense  Refill  . amoxicillin-clavulanate (AUGMENTIN) 875-125 MG tablet   Oral   Take 1 tablet by mouth 2 (two) times daily.   20 tablet   0   . glipiZIDE (GLUCOTROL) 10 MG tablet   Oral   Take 10 mg by mouth daily before breakfast.         . ibuprofen (ADVIL) 200 MG tablet   Oral   Take 2 tablets (400 mg total) by mouth every 8 (eight) hours as needed for moderate pain.   30 tablet   0   . lisinopril (PRINIVIL,ZESTRIL)  5 MG tablet   Oral   Take 5 mg by mouth daily.         . meloxicam (MOBIC) 15 MG tablet   Oral   Take 15 mg by mouth daily.         . metFORMIN (GLUCOPHAGE) 500 MG tablet   Oral   Take by mouth 2 (two) times daily with a meal.         . pravastatin (PRAVACHOL) 80 MG tablet   Oral   Take 80 mg by mouth daily.         . sertraline (ZOLOFT) 50 MG tablet   Oral   Take 50 mg by mouth daily.         . SitaGLIPtin-MetFORMIN HCl 50-500 MG TB24   Oral   Take by mouth.         . traMADol (ULTRAM) 50 MG tablet   Oral   Take 1 tablet (50 mg total) by mouth every 12 (twelve) hours as needed for moderate pain.   12 tablet   0     Allergies Crestor; Lipitor; Liraglutide; Sulfa antibiotics; Voltaren; and Zocor  Family History  Problem Relation Age of Onset  . Breast cancer Sister     Social History Social History  Substance Use Topics  . Smoking status: Former Research scientist (life sciences)  .  Smokeless tobacco: None  . Alcohol Use: No    Review of Systems Constitutional: No fever/chills Eyes: No visual changes. ENT: No sore throat. Cardiovascular: Denies chest pain. Respiratory: Denies shortness of breath. Gastrointestinal: No abdominal pain.  No nausea, no vomiting.  No diarrhea.  No constipation. Genitourinary: Negative for dysuria. Musculoskeletal: Negative for back pain. Skin: Negative for rash. Multiple scabbed over puncture wounds to the dorsal aspect of the right hand. Mild edema and erythema. Neurological: Negative for headaches, focal weakness or numbness. Endocrine: Hypertension and diabetes. Hematological/Lymphatic: Allergic/Immunilogical: See medication list.   ____________________________________________   PHYSICAL EXAM:  VITAL SIGNS: ED Triage Vitals  Enc Vitals Group     BP 12/17/15 1553 140/88 mmHg     Pulse Rate 12/17/15 1553 86     Resp 12/17/15 1553 16     Temp 12/17/15 1553 97.7 F (36.5 C)     Temp Source 12/17/15 1553 Oral     SpO2 12/17/15  1553 97 %     Weight 12/17/15 1553 252 lb (114.306 kg)     Height 12/17/15 1553 5\' 2"  (1.575 m)     Head Cir --      Peak Flow --      Pain Score 12/17/15 1554 3     Pain Loc --      Pain Edu? --      Excl. in [place]? --     Constitutional: Alert and oriented. Well appearing and in no acute distress. Eyes: Conjunctivae are normal. PERRL. EOMI. Head: Atraumatic. Nose: No congestion/rhinnorhea. Mouth/Throat: Mucous membranes are moist.  Oropharynx non-erythematous. Neck: No stridor.  No cervical spine tenderness to palpation. Hematological/Lymphatic/Immunilogical: No cervical lymphadenopathy. Cardiovascular: Normal rate, regular rhythm. Grossly normal heart sounds.  Good peripheral circulation. Respiratory: Normal respiratory effort.  No retractions. Lungs CTAB. Gastrointestinal: Soft and nontender. No distention. No abdominal bruits. No CVA tenderness. Musculoskeletal: No lower extremity tenderness nor edema.  No joint effusions. Neurologic:  Normal speech and language. No gross focal neurologic deficits are appreciated. No gait instability. Skin:  Skin is warm, dry and intact. No rash noted. Scabbed over puncture wounds dorsal aspect of the right hand. Mild edema and erythema. Psychiatric: Mood and affect are normal. Speech and behavior are normal.  ____________________________________________   LABS (all labs ordered are listed, but only abnormal results are displayed)  Labs Reviewed - No data to display ____________________________________________  EKG   ____________________________________________  RADIOLOGY   ____________________________________________   PROCEDURES  Procedure(s) performed: None  Critical Care performed: No  ____________________________________________   INITIAL IMPRESSION / ASSESSMENT AND PLAN / ED COURSE  Pertinent labs & imaging results that were available during my care of the patient were reviewed by me and considered in my medical  decision making (see chart for details).  Dog bite with cellulitis to the right hand. Patient given discharge care instructions. Patient given a tetanus shot, Augmentin, ibuprofen, and tramadol. Patient advised follow-up with family doctor in 2 days for reevaluation return by ER for condition worsens. ____________________________________________   FINAL CLINICAL IMPRESSION(S) / ED DIAGNOSES  Final diagnoses:  Dog bite of right hand with infection, initial encounter      Sable Feil, PA-C 12/17/15 Basile, PA-C 12/17/15 1631  Daymon Larsen, MD 12/17/15 [phone]

## 2015-12-17 NOTE — ED Notes (Signed)
Pt to ed with c/o dog bite yesterday.  Reports it was her own dog.  Police notified.  Pt with multiple puncture wounds to right hand and right arm.  Redness and swelling noted to pt right hand.  Good movement, +pulse, +movement, +sensation.

## 2015-12-17 NOTE — ED Notes (Signed)
Pt arrived to ED reporting being bit by own dog yesterday morning. Pt has multiple puncture marks on right hand and along right Wrist. Pts right hand is red and swollen and pt reports that fingers feel "numb and tinging."

## 2017-03-08 ENCOUNTER — Other Ambulatory Visit: Payer: Self-pay | Admitting: Family Medicine

## 2017-03-08 DIAGNOSIS — Z1231 Encounter for screening mammogram for malignant neoplasm of breast: Secondary | ICD-10-CM

## 2017-04-11 ENCOUNTER — Ambulatory Visit
Admission: RE | Admit: 2017-04-11 | Discharge: 2017-04-11 | Disposition: A | Discharge status: Home / Self Care | Payer: Medicare HMO | Source: Ambulatory Visit | Attending: Family Medicine | Admitting: Family Medicine

## 2017-04-11 DIAGNOSIS — Z1231 Encounter for screening mammogram for malignant neoplasm of breast: Secondary | ICD-10-CM | POA: Insufficient documentation

## 2018-09-23 ENCOUNTER — Inpatient Hospital Stay
Admission: EM | Admit: 2018-09-23 | Discharge: 2018-09-25 | DRG: 065 | Disposition: A | Discharge status: Home / Self Care | Payer: Medicare HMO | Attending: Internal Medicine | Admitting: Internal Medicine

## 2018-09-23 ENCOUNTER — Inpatient Hospital Stay: Payer: Medicare HMO

## 2018-09-23 ENCOUNTER — Encounter: Payer: Self-pay | Admitting: Emergency Medicine

## 2018-09-23 ENCOUNTER — Emergency Department: Payer: Medicare HMO

## 2018-09-23 ENCOUNTER — Other Ambulatory Visit: Payer: Self-pay

## 2018-09-23 DIAGNOSIS — E118 Type 2 diabetes mellitus with unspecified complications: Secondary | ICD-10-CM | POA: Diagnosis not present

## 2018-09-23 DIAGNOSIS — Z79899 Other long term (current) drug therapy: Secondary | ICD-10-CM

## 2018-09-23 DIAGNOSIS — I10 Essential (primary) hypertension: Secondary | ICD-10-CM | POA: Diagnosis present

## 2018-09-23 DIAGNOSIS — H5340 Unspecified visual field defects: Secondary | ICD-10-CM | POA: Diagnosis present

## 2018-09-23 DIAGNOSIS — I429 Cardiomyopathy, unspecified: Secondary | ICD-10-CM | POA: Diagnosis present

## 2018-09-23 DIAGNOSIS — Z87891 Personal history of nicotine dependence: Secondary | ICD-10-CM

## 2018-09-23 DIAGNOSIS — Z888 Allergy status to other drugs, medicaments and biological substances status: Secondary | ICD-10-CM

## 2018-09-23 DIAGNOSIS — R29701 NIHSS score 1: Secondary | ICD-10-CM | POA: Diagnosis present

## 2018-09-23 DIAGNOSIS — E1165 Type 2 diabetes mellitus with hyperglycemia: Secondary | ICD-10-CM | POA: Diagnosis present

## 2018-09-23 DIAGNOSIS — Z7984 Long term (current) use of oral hypoglycemic drugs: Secondary | ICD-10-CM

## 2018-09-23 DIAGNOSIS — H53462 Homonymous bilateral field defects, left side: Secondary | ICD-10-CM | POA: Diagnosis present

## 2018-09-23 DIAGNOSIS — E785 Hyperlipidemia, unspecified: Secondary | ICD-10-CM | POA: Diagnosis present

## 2018-09-23 DIAGNOSIS — Z6841 Body Mass Index (BMI) 40.0 and over, adult: Secondary | ICD-10-CM | POA: Diagnosis not present

## 2018-09-23 DIAGNOSIS — F329 Major depressive disorder, single episode, unspecified: Secondary | ICD-10-CM | POA: Diagnosis present

## 2018-09-23 DIAGNOSIS — Z882 Allergy status to sulfonamides status: Secondary | ICD-10-CM

## 2018-09-23 DIAGNOSIS — I447 Left bundle-branch block, unspecified: Secondary | ICD-10-CM | POA: Diagnosis not present

## 2018-09-23 DIAGNOSIS — H5462 Unqualified visual loss, left eye, normal vision right eye: Secondary | ICD-10-CM | POA: Diagnosis present

## 2018-09-23 DIAGNOSIS — I454 Nonspecific intraventricular block: Secondary | ICD-10-CM | POA: Diagnosis present

## 2018-09-23 DIAGNOSIS — R931 Abnormal findings on diagnostic imaging of heart and coronary circulation: Secondary | ICD-10-CM | POA: Diagnosis not present

## 2018-09-23 DIAGNOSIS — I639 Cerebral infarction, unspecified: Secondary | ICD-10-CM

## 2018-09-23 DIAGNOSIS — I63431 Cerebral infarction due to embolism of right posterior cerebral artery: Secondary | ICD-10-CM | POA: Diagnosis not present

## 2018-09-23 DIAGNOSIS — E782 Mixed hyperlipidemia: Secondary | ICD-10-CM | POA: Diagnosis not present

## 2018-09-23 LAB — COMPREHENSIVE METABOLIC PANEL
ALBUMIN: 3.8 g/dL (ref 3.5–5.0)
ALT: 15 U/L (ref 0–44)
ANION GAP: 9 (ref 5–15)
AST: 18 U/L (ref 15–41)
Alkaline Phosphatase: 60 U/L (ref 38–126)
BILIRUBIN TOTAL: 0.7 mg/dL (ref 0.3–1.2)
BUN: 23 mg/dL (ref 8–23)
CO2: 27 mmol/L (ref 22–32)
Calcium: 9.5 mg/dL (ref 8.9–10.3)
Chloride: 101 mmol/L (ref 98–111)
Creatinine, Ser: 1.06 mg/dL — ABNORMAL HIGH (ref 0.44–1.00)
GFR, EST AFRICAN AMERICAN: 59 mL/min — AB (ref >60)
GFR, EST NON AFRICAN AMERICAN: 51 mL/min — AB (ref >60)
GLUCOSE: 394 mg/dL — AB (ref 70–99)
POTASSIUM: 4.9 mmol/L (ref 3.5–5.1)
Sodium: 137 mmol/L (ref 135–145)
TOTAL PROTEIN: 6.9 g/dL (ref 6.5–8.1)

## 2018-09-23 LAB — DIFFERENTIAL
Abs Immature Granulocytes: 0.04 10*3/uL (ref 0.00–0.07)
Basophils Absolute: 0.1 10*3/uL (ref 0.0–0.1)
Basophils Relative: 1 %
EOS ABS: 0.2 10*3/uL (ref 0.0–0.5)
EOS PCT: 3 %
IMMATURE GRANULOCYTES: 1 %
LYMPHS ABS: 1.5 10*3/uL (ref 0.7–4.0)
LYMPHS PCT: 19 %
MONO ABS: 0.5 10*3/uL (ref 0.1–1.0)
Monocytes Relative: 6 %
Neutro Abs: 5.6 10*3/uL (ref 1.7–7.7)
Neutrophils Relative %: 70 %

## 2018-09-23 LAB — CBC
HCT: 40.4 % (ref 36.0–46.0)
Hemoglobin: 12.9 g/dL (ref 12.0–15.0)
MCH: 28.9 pg (ref 26.0–34.0)
MCHC: 31.9 g/dL (ref 30.0–36.0)
MCV: 90.6 fL (ref 80.0–100.0)
NRBC: 0 % (ref 0.0–0.2)
PLATELETS: 223 10*3/uL (ref 150–400)
RBC: 4.46 MIL/uL (ref 3.87–5.11)
RDW: 12.4 % (ref 11.5–15.5)
WBC: 7.9 10*3/uL (ref 4.0–10.5)

## 2018-09-23 LAB — GLUCOSE, CAPILLARY
Glucose-Capillary: 252 mg/dL — ABNORMAL HIGH (ref 70–99)
Glucose-Capillary: 146 mg/dL — ABNORMAL HIGH (ref 70–99)

## 2018-09-23 LAB — APTT: aPTT: 27 seconds (ref 24–36)

## 2018-09-23 LAB — TROPONIN I: TROPONIN I: 0.03 ng/mL — AB (ref <0.03)

## 2018-09-23 LAB — PROTIME-INR
INR: 0.95
PROTHROMBIN TIME: 12.6 s (ref 11.4–15.2)

## 2018-09-23 MED ORDER — ASPIRIN 325 MG PO TABS
325.0000 mg | ORAL_TABLET | Freq: Every day | ORAL | Status: DC
  Administered 2018-09-23 – 2018-09-24 (×2): 325 mg via ORAL
  Filled 2018-09-23 (×2): qty 1

## 2018-09-23 MED ORDER — INSULIN ASPART 100 UNIT/ML ~~LOC~~ SOLN
0.0000 [IU] | Freq: Three times a day (TID) | SUBCUTANEOUS | Status: DC
  Administered 2018-09-23: 19:00:00 8 [IU] via SUBCUTANEOUS
  Administered 2018-09-24: 09:00:00 5 [IU] via SUBCUTANEOUS
  Administered 2018-09-24: 15:00:00 8 [IU] via SUBCUTANEOUS
  Administered 2018-09-24: 19:00:00 5 [IU] via SUBCUTANEOUS
  Administered 2018-09-25 (×2): 3 [IU] via SUBCUTANEOUS
  Filled 2018-09-23 (×5): qty 1

## 2018-09-23 MED ORDER — ASPIRIN 81 MG PO CHEW
324.0000 mg | CHEWABLE_TABLET | Freq: Once | ORAL | Status: AC
Start: 2018-09-23 — End: 2018-09-23
  Administered 2018-09-23: 324 mg via ORAL
  Filled 2018-09-23: qty 4

## 2018-09-23 MED ORDER — STROKE: EARLY STAGES OF RECOVERY BOOK
Freq: Once | Status: AC
  Administered 2018-09-23: 19:00:00

## 2018-09-23 MED ORDER — SERTRALINE HCL 50 MG PO TABS
50.0000 mg | ORAL_TABLET | Freq: Every day | ORAL | Status: DC
  Administered 2018-09-24 – 2018-09-25 (×2): 50 mg via ORAL
  Filled 2018-09-23 (×3): qty 1

## 2018-09-23 MED ORDER — HYDRALAZINE HCL 20 MG/ML IJ SOLN: 10.0000 mg | Freq: Four times a day (QID) | INTRAMUSCULAR | Status: DC | PRN

## 2018-09-23 MED ORDER — ACETAMINOPHEN 160 MG/5ML PO SOLN
650.0000 mg | ORAL | Status: DC | PRN
  Filled 2018-09-23: qty 20.3

## 2018-09-23 MED ORDER — INSULIN GLARGINE 100 UNIT/ML ~~LOC~~ SOLN
10.0000 [IU] | Freq: Every day | SUBCUTANEOUS | Status: DC
  Administered 2018-09-23 – 2018-09-24 (×2): 10 [IU] via SUBCUTANEOUS
  Filled 2018-09-23 (×3): qty 0.1

## 2018-09-23 MED ORDER — PRAVASTATIN SODIUM 20 MG PO TABS
80.0000 mg | ORAL_TABLET | Freq: Every day | ORAL | Status: DC
  Administered 2018-09-23 – 2018-09-25 (×3): 80 mg via ORAL
  Filled 2018-09-23 (×3): qty 4

## 2018-09-23 MED ORDER — ENOXAPARIN SODIUM 40 MG/0.4ML ~~LOC~~ SOLN
40.0000 mg | SUBCUTANEOUS | Status: DC
  Administered 2018-09-23: 40 mg via SUBCUTANEOUS
  Filled 2018-09-23: qty 0.4

## 2018-09-23 MED ORDER — ACETAMINOPHEN 650 MG RE SUPP: 650.0000 mg | RECTAL | Status: DC | PRN

## 2018-09-23 MED ORDER — ASPIRIN 300 MG RE SUPP: 300.0000 mg | Freq: Every day | RECTAL | Status: DC

## 2018-09-23 MED ORDER — IOHEXOL 350 MG/ML SOLN
75.0000 mL | Freq: Once | INTRAVENOUS | Status: AC | PRN
  Administered 2018-09-23: 20:00:00 75 mL via INTRAVENOUS

## 2018-09-23 MED ORDER — ACETAMINOPHEN 325 MG PO TABS
650.0000 mg | ORAL_TABLET | ORAL | Status: DC | PRN
  Administered 2018-09-23 – 2018-09-25 (×4): 650 mg via ORAL
  Filled 2018-09-23 (×4): qty 2

## 2018-09-23 MED ORDER — INSULIN ASPART 100 UNIT/ML ~~LOC~~ SOLN: 0.0000 [IU] | Freq: Every day | SUBCUTANEOUS | Status: DC

## 2018-09-23 MED ORDER — TRAMADOL HCL 50 MG PO TABS: 50.0000 mg | ORAL_TABLET | Freq: Two times a day (BID) | ORAL | Status: DC | PRN

## 2018-09-23 NOTE — ED Notes (Signed)
ED TO INPATIENT HANDOFF REPORT  Name/Age/Gender April [name] 77 y.o. female  Code Status Full  Home/SNF/Other home  Chief Complaint confusion;loss of vision  Level of Care/Admitting Diagnosis ED Disposition    ED Disposition Condition [place]: [place] [[id number]]  Level of Care: Med-Surg [16]  Diagnosis: Stroke (cerebrum) [facility]) [[id number]]  Admitting Physician: Bettey Costa [[id number]]  Attending Physician: MODY, Ulice Bold [[id number]]  Estimated length of stay: past midnight tomorrow  Certification:: I certify this patient will need inpatient services for at least 2 midnights  PT Class (Do Not Modify): Inpatient [101]  PT Acc Code (Do Not Modify): Private [1]       Medical History Past Medical History:  Diagnosis Date  . B12 deficiency   . Cataracts, both eyes   . Diabetes mellitus without complication ([place])   . Dyslipidemia   . Hypertension     Allergies Allergies  Allergen Reactions  . Crestor [Rosuvastatin Calcium]   . Lipitor [Atorvastatin]   . Liraglutide   . Sulfa Antibiotics   . Voltaren [Diclofenac Sodium] Nausea Only  . Zocor [Simvastatin] Other (See Comments)    Muscle pain    IV Location/Drains/Wounds Patient Lines/Drains/Airways Status   Active Line/Drains/Airways    Name:   Placement date:   Placement time:   Site:   Days:   Peripheral IV 09/23/18 Right Antecubital   09/23/18    1243    Antecubital   less than 1   Airway   07/04/15    0846     1177          Labs/Imaging Results for orders placed or performed during the hospital encounter of 09/23/18 (from the past 48 hour(s))  Protime-INR     Status: None   Collection Time: 09/23/18 11:27 AM  Result Value Ref Range   Prothrombin Time 12.6 11.4 - 15.2 seconds   INR 0.95     Comment: Performed at [facility], [address] [place], [place] [address]  APTT     Status: None   Collection Time: 09/23/18 11:27 AM  Result Value Ref Range    aPTT 27 24 - 36 seconds    Comment: Performed at [facility], [address] [place], [place] [address]  CBC     Status: None   Collection Time: 09/23/18 11:27 AM  Result Value Ref Range   WBC 7.9 4.0 - 10.5 K/uL   RBC 4.46 3.87 - 5.11 MIL/uL   Hemoglobin 12.9 12.0 - 15.0 g/dL   HCT 40.4 36.0 - 46.0 %   MCV 90.6 80.0 - 100.0 fL   MCH 28.9 26.0 - 34.0 pg   MCHC 31.9 30.0 - 36.0 g/dL   RDW 12.4 11.5 - 15.5 %   Platelets 223 150 - 400 K/uL   nRBC 0.0 0.0 - 0.2 %    Comment: Performed at [facility], [address] [place], [place] [address]  Differential     Status: None   Collection Time: 09/23/18 11:27 AM  Result Value Ref Range   Neutrophils Relative % 70 %   Neutro Abs 5.6 1.7 - 7.7 K/uL   Lymphocytes Relative 19 %   Lymphs Abs 1.5 0.7 - 4.0 K/uL   Monocytes Relative 6 %   Monocytes Absolute 0.5 0.1 - 1.0 K/uL   Eosinophils Relative 3 %   Eosinophils Absolute 0.2 0.0 - 0.5 K/uL   Basophils Relative 1 %   Basophils Absolute  0.1 0.0 - 0.1 K/uL   Immature Granulocytes 1 %   Abs Immature Granulocytes 0.04 0.00 - 0.07 K/uL    Comment: Performed at [facility], [address] [place], [place] [address]  Comprehensive metabolic panel     Status: Abnormal   Collection Time: 09/23/18 11:27 AM  Result Value Ref Range   Sodium 137 135 - 145 mmol/L   Potassium 4.9 3.5 - 5.1 mmol/L   Chloride 101 98 - 111 mmol/L   CO2 27 22 - 32 mmol/L   Glucose, Bld 394 (H) 70 - 99 mg/dL   BUN 23 8 - 23 mg/dL   Creatinine, Ser 1.06 (H) 0.44 - 1.00 mg/dL   Calcium 9.5 8.9 - 10.3 mg/dL   Total Protein 6.9 6.5 - 8.1 g/dL   Albumin 3.8 3.5 - 5.0 g/dL   AST 18 15 - 41 U/L   ALT 15 0 - 44 U/L   Alkaline Phosphatase 60 38 - 126 U/L   Total Bilirubin 0.7 0.3 - 1.2 mg/dL   GFR calc non Af Amer 51 (L) >60 mL/min   GFR calc Af Amer 59 (L) >60 mL/min   Anion gap 9 5 - 15    Comment: Performed at [facility], [address] [place], [place] [address]   Troponin I - Once     Status: Abnormal   Collection Time: 09/23/18 11:27 AM  Result Value Ref Range   Troponin I 0.03 (HH) <0.03 ng/mL    Comment: CRITICAL RESULT CALLED TO, READ BACK BY AND VERIFIED WITH Maahir Horst @1156  09/23/18 BY FMW Performed at [facility], [address] [place], [place] [address]    Ct Head Wo Contrast  Result Date: 09/23/2018 CLINICAL DATA:  Vision loss left eye.  Severe headache 3 days EXAM: CT HEAD WITHOUT CONTRAST TECHNIQUE: Contiguous axial images were obtained from the base of the skull through the vertex without intravenous contrast. COMPARISON:  CT head 04/12/2011 FINDINGS: Brain: Hypodensity right occipital lobe most compatible with acute/subacute infarct. No hemorrhage. Ventricle size normal. Mild chronic white matter changes. No midline shift or mass. Vascular: Negative for hyperdense vessel Skull: Negative Sinuses/Orbits: Negative Other: None IMPRESSION: Hypodensity right occipital lobe compatible with acute or subacute infarct. Negative for hemorrhage. These results were called by telephone at the time of interpretation on 09/23/2018 at 11:39 am to Dr. Lavonia Drafts , who verbally acknowledged these results. Electronically Signed   By: Franchot Gallo M.D.   On: 09/23/2018 11:40    Pending Labs [employer] (From admission, onward)    Start     Ordered   Signed and Held  Hemoglobin A1c  Tomorrow morning,   R     Signed and Held   Signed and Held  Lipid panel  Tomorrow morning,   R    Comments:  Fasting    Signed and Held   Signed and Held  CBC  (enoxaparin (LOVENOX)    CrCl >/= 30 ml/min)  Once,   R    Comments:  Baseline for enoxaparin therapy IF NOT ALREADY DRAWN.  Notify MD if PLT < 100 K.    Signed and Held   Signed and Held  Creatinine, serum  (enoxaparin (LOVENOX)    CrCl >/= 30 ml/min)  Once,   R    Comments:  Baseline for enoxaparin therapy IF NOT ALREADY DRAWN.    Signed and Held   Signed and Held  Creatinine, serum   (enoxaparin (LOVENOX)  CrCl >/= 30 ml/min)  Weekly,   R    Comments:  while on enoxaparin therapy    Signed and Held          Vitals/Pain Today's Vitals   09/23/18 1114 09/23/18 1200 09/23/18 1211 09/23/18 1230  BP:  (!) 153/89  139/62  Pulse:  85  78  Resp:      Temp:      TempSrc:      SpO2:  100%  96%  Weight: 114.8 kg     Height: 5\' 2"  (1.575 m)     PainSc: 0-No pain  0-No pain     Isolation Precautions No active isolations  Medications Medications  aspirin chewable tablet 324 mg (324 mg Oral Given 09/23/18 1203)    Mobility ambulatory

## 2018-09-23 NOTE — ED Triage Notes (Signed)
Pt to ED from home c/o visual loss in the left eye outer peripheral when she awoke Monday morning.  Pt states went to sleep on Sunday night normal, has had a headache since Saturday behind right eye.  Denies hx of stroke, denies numbness tingling, strength through extremities equal and strong, A&Ox4.

## 2018-09-23 NOTE — ED Provider Notes (Signed)
[facility] Emergency Department Provider Note       Time seen: ----------------------------------------- 11:49 AM on 09/23/2018 -----------------------------------------   I have reviewed the triage vital signs and the nursing notes.  HISTORY   Chief Complaint Loss of Vision    HPI April [name] is a 77 y.o. female with a history of diabetes, hyperlipidemia and hypertension who presents to the ED for vision loss on the left side that started when she woke up on Monday morning.  Patient states she went to sleep on Sunday night normal, has had a headache since Saturday behind her right eye.  She denies any history of stroke, denies numbness, tingling, weakness, trouble swallowing or balance disturbance.  She has never had these issues before.  Past Medical History:  Diagnosis Date  . B12 deficiency   . Cataracts, both eyes   . Diabetes mellitus without complication ([place])   . Dyslipidemia   . Hypertension     There are no active problems to display for this patient.   Past Surgical History:  Procedure Laterality Date  . CATARACT EXTRACTION    . COLONOSCOPY    . COLONOSCOPY WITH PROPOFOL N/A 07/04/2015   Procedure: COLONOSCOPY WITH PROPOFOL;  Surgeon: Josefine Class, MD;  Location: [facility] ENDOSCOPY;  Service: Endoscopy;  Laterality: N/A;  . EYE SURGERY      Allergies Crestor [rosuvastatin calcium]; Lipitor [atorvastatin]; Liraglutide; Sulfa antibiotics; Voltaren [diclofenac sodium]; and Zocor [simvastatin]  Social History Social History   Tobacco Use  . Smoking status: Former Research scientist (life sciences)  . Smokeless tobacco: Never Used  Substance Use Topics  . Alcohol use: No  . Drug use: Never   Review of Systems Constitutional: Negative for fever. Eyes: Positive for vision changes ENT:  Negative for congestion, sore throat Cardiovascular: Negative for chest pain. Respiratory: Negative for shortness of breath. Gastrointestinal: Negative for abdominal  pain, vomiting and diarrhea. Musculoskeletal: Negative for back pain. Skin: Negative for rash. Neurological: Positive for headache  All systems negative/normal/unremarkable except as stated in the HPI  ____________________________________________   PHYSICAL EXAM:  VITAL SIGNS: ED Triage Vitals  Enc Vitals Group     BP 09/23/18 1113 134/67     Pulse Rate 09/23/18 1113 91     Resp 09/23/18 1113 16     Temp 09/23/18 1113 97.7 F (36.5 C)     Temp Source 09/23/18 1113 Oral     SpO2 09/23/18 1113 97 %     Weight 09/23/18 1114 253 lb (114.8 kg)     Height 09/23/18 1114 5\' 2"  (1.575 m)     Head Circumference --      Peak Flow --      Pain Score 09/23/18 1114 0     Pain Loc --      Pain Edu? --      Excl. in [place]? --    Constitutional: Alert and oriented. Well appearing and in no distress. Eyes: Conjunctivae are normal. Normal extraocular movements. ENT   Head: Normocephalic and atraumatic.   Nose: No congestion/rhinnorhea.   Mouth/Throat: Mucous membranes are moist.   Neck: No stridor. Cardiovascular: Normal rate, regular rhythm. No murmurs, rubs, or gallops. Respiratory: Normal respiratory effort without tachypnea nor retractions. Breath sounds are clear and equal bilaterally. No wheezes/rales/rhonchi. Gastrointestinal: Soft and nontender. Normal bowel sounds Musculoskeletal: Nontender with normal range of motion in extremities. No lower extremity tenderness nor edema. Neurologic:  Normal speech and language. No gross focal neurologic deficits are appreciated.  There is a  left homonymous hemianopsia, strength, sensation, cranial nerves otherwise normal Skin:  Skin is warm, dry and intact. No rash noted. Psychiatric: Mood and affect are normal. Speech and behavior are normal.  ____________________________________________  EKG: Interpreted by me.  Sinus rhythm rate 83 bpm, normal axis, left bundle branch block  ____________________________________________  ED  COURSE:  As part of my medical decision making, I reviewed the following data within the [place] History obtained from family if available, nursing notes, old chart and ekg, as well as notes from prior ED visits. Patient presented for vision changes, we will assess with labs and imaging as indicated at this time.   Procedures ____________________________________________   LABS (pertinent positives/negatives)  Labs Reviewed  COMPREHENSIVE METABOLIC PANEL - Abnormal; Notable for the following components:      Result Value   Glucose, Bld 394 (*)    Creatinine, Ser 1.06 (*)    GFR calc non Af Amer 51 (*)    GFR calc Af Amer 59 (*)    All other components within normal limits  TROPONIN I - Abnormal; Notable for the following components:   Troponin I 0.03 (*)    All other components within normal limits  LIPID PANEL - Abnormal; Notable for the following components:   Triglycerides 163 (*)    LDL Cholesterol 101 (*)    All other components within normal limits  GLUCOSE, CAPILLARY - Abnormal; Notable for the following components:   Glucose-Capillary 252 (*)    All other components within normal limits  GLUCOSE, CAPILLARY - Abnormal; Notable for the following components:   Glucose-Capillary 146 (*)    All other components within normal limits  PROTIME-INR  APTT  CBC  DIFFERENTIAL  HEMOGLOBIN A1C  CBG MONITORING, ED    RADIOLOGY  CT head IMPRESSION: Hypodensity right occipital lobe compatible with acute or subacute infarct. Negative for hemorrhage.  These results were called by telephone at the time of interpretation on 09/23/2018 at 11:39 am to Dr. Lavonia Drafts , who verbally acknowledged these results.  ____________________________________________  DIFFERENTIAL DIAGNOSIS   CVA, TIA, ocular migraine  FINAL ASSESSMENT AND PLAN  CVA   Plan: The patient had presented for acute strokelike symptoms involving her vision that began yesterday morning.  Patient's labs did reveal some hyperglycemia and a slightly elevated troponin. 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  We have started her on aspirin, she is not a TPA candidate.  Currently again only left homonymous hemianopsia.  I will discuss with the hospitalist for admission.   Laurence Aly, MD   Note: This note was generated in part or whole with voice recognition software. Voice recognition is usually quite accurate but there are transcription errors that can and very often do occur. I apologize for any typographical errors that were not detected and corrected.     Earleen Newport, MD 09/24/18 [phone]

## 2018-09-23 NOTE — Progress Notes (Signed)
Family Meeting Note  Advance Directive:no  Today a meeting took place with the Patient.  The following clinical team members were present during this meeting:MD  The following were discussed:Patient's diagnosis: acute cva diabetes, Patient's progosis: > 12 months and Goals for treatment: Full Code  Additional follow-up to be provided: chaplain consultation requested to start advanced directive planning  Time spent during discussion: 16 minutes  Sabree Nuon, MD

## 2018-09-23 NOTE — H&P (Signed)
[facility] NAME: April [name]    MR#:  [id number]  DATE OF BIRTH:  [birth year]  DATE OF ADMISSION:  09/23/2018  PRIMARY CARE PHYSICIAN: Maryland Pink, MD   REQUESTING/REFERRING PHYSICIAN: dr Jimmye Norman  CHIEF COMPLAINT:   Left eye vision loss HISTORY OF PRESENT ILLNESS:  April [name]  is a 77 y.o. female with a known history of diabetes and depression who presents today due to left eye vision loss.  Patient since she woke up yesterday morning with left peripheral vision loss.  She thought it would improve throughout the day so she did not come to the ER.  This morning she continues to have left peripheral visual loss and went to see her PCP who sent her to the ER for further evaluation.  CT scan performed in the emergency department shows Hypodensity right occipital lobe compatible with acute or subacute infarct. Negative for hemorrhage.  Patient has received aspirin. PAST MEDICAL HISTORY:   Past Medical History:  Diagnosis Date  . B12 deficiency   . Cataracts, both eyes   . Diabetes mellitus without complication ([place])   . Dyslipidemia   . Hypertension     PAST SURGICAL HISTORY:   Past Surgical History:  Procedure Laterality Date  . CATARACT EXTRACTION    . COLONOSCOPY    . COLONOSCOPY WITH PROPOFOL N/A 07/04/2015   Procedure: COLONOSCOPY WITH PROPOFOL;  Surgeon: Josefine Class, MD;  Location: [facility] ENDOSCOPY;  Service: Endoscopy;  Laterality: N/A;  . EYE SURGERY      SOCIAL HISTORY:   Social History   Tobacco Use  . Smoking status: Former Research scientist (life sciences)  . Smokeless tobacco: Never Used  Substance Use Topics  . Alcohol use: No    FAMILY HISTORY:   Family History  Problem Relation Age of Onset  . Breast cancer Sister 67    DRUG ALLERGIES:   Allergies  Allergen Reactions  . Crestor [Rosuvastatin Calcium]   . Lipitor [Atorvastatin]   . Liraglutide   . Sulfa Antibiotics   . Voltaren [Diclofenac Sodium] Nausea  Only  . Zocor [Simvastatin] Other (See Comments)    Muscle pain    REVIEW OF SYSTEMS:   Review of Systems  Constitutional: Negative.  Negative for chills, fever and malaise/fatigue.  HENT: Negative.  Negative for ear discharge, ear pain, hearing loss, nosebleeds and sore throat.   Eyes: Negative.  Negative for blurred vision and pain.  Respiratory: Negative.  Negative for cough, hemoptysis, shortness of breath and wheezing.   Cardiovascular: Negative.  Negative for chest pain, palpitations and leg swelling.  Gastrointestinal: Negative.  Negative for abdominal pain, blood in stool, diarrhea, nausea and vomiting.  Genitourinary: Negative.  Negative for dysuria.  Musculoskeletal: Negative.  Negative for back pain.  Skin: Negative.   Neurological: Negative for dizziness, tremors, speech change, focal weakness, seizures and headaches.       Left eye peripheral visual loss and some disorientation  Endo/Heme/Allergies: Negative.  Does not bruise/bleed easily.  Psychiatric/Behavioral: Negative.  Negative for depression, hallucinations and suicidal ideas.    MEDICATIONS AT HOME:   Prior to Admission medications   Medication Sig Start Date End Date Taking? Authorizing Provider  cyanocobalamin (,VITAMIN B-12,) 1000 MCG/ML injection Inject 1,000 mcg into the muscle every 30 (thirty) days. 02/18/14  Yes [provider]  glipiZIDE (GLUCOTROL) 10 MG tablet Take 10 mg by mouth daily before breakfast.   Yes [provider]  lisinopril (PRINIVIL,ZESTRIL) 5 MG tablet Take 5  mg by mouth daily.   Yes [provider]  meloxicam (MOBIC) 15 MG tablet Take 15 mg by mouth daily.   Yes [provider]  metFORMIN (GLUCOPHAGE) 500 MG tablet Take by mouth 2 (two) times daily with a meal.   Yes [provider]  pravastatin (PRAVACHOL) 80 MG tablet Take 80 mg by mouth daily.   Yes [provider]  sertraline (ZOLOFT) 50 MG tablet Take 50 mg by mouth daily.   Yes  [provider]  SitaGLIPtin-MetFORMIN HCl 50-500 MG TB24 Take by mouth.   Yes [provider]  amoxicillin-clavulanate (AUGMENTIN) 875-125 MG tablet Take 1 tablet by mouth 2 (two) times daily. Patient not taking: Reported on 09/23/2018 12/17/15   Sable Feil, PA-C  ibuprofen (ADVIL) 200 MG tablet Take 2 tablets (400 mg total) by mouth every 8 (eight) hours as needed for moderate pain. Patient not taking: Reported on 09/23/2018 12/17/15   Sable Feil, PA-C  traMADol (ULTRAM) 50 MG tablet Take 1 tablet (50 mg total) by mouth every 12 (twelve) hours as needed for moderate pain. Patient not taking: Reported on 09/23/2018 12/17/15   Sable Feil, PA-C      VITAL SIGNS:  Blood pressure 134/67, pulse 91, temperature 97.7 F (36.5 C), temperature source Oral, resp. rate 16, height 5\' 2"  (1.575 m), weight 114.8 kg, SpO2 97 %.  PHYSICAL EXAMINATION:   Physical Exam  Constitutional: She is oriented to person, place, and time. No distress.  HENT:  Head: Normocephalic.  Eyes: No scleral icterus.  Neck: Normal range of motion. Neck supple. No JVD present. No tracheal deviation present.  Cardiovascular: Normal rate, regular rhythm and normal heart sounds. Exam reveals no gallop and no friction rub.  No murmur heard. Pulmonary/Chest: Effort normal and breath sounds normal. No respiratory distress. She has no wheezes. She has no rales. She exhibits no tenderness.  Abdominal: Soft. Bowel sounds are normal. She exhibits no distension and no mass. There is no tenderness. There is no rebound and no guarding.  Musculoskeletal: Normal range of motion. She exhibits no edema.  Neurological: She is alert and oriented to person, place, and time. A cranial nerve deficit is present.  Visual loss of the left peripheral vision  Skin: Skin is warm. No rash noted. No erythema.  Psychiatric: Judgment normal.      LABORATORY PANEL:   CBC Recent Labs  Lab 09/23/18 1127  WBC 7.9  HGB  12.9  HCT 40.4  PLT 223   ------------------------------------------------------------------------------------------------------------------  Chemistries  Recent Labs  Lab 09/23/18 1127  NA 137  K 4.9  CL 101  CO2 27  GLUCOSE 394*  BUN 23  CREATININE 1.06*  CALCIUM 9.5  AST 18  ALT 15  ALKPHOS 60  BILITOT 0.7   ------------------------------------------------------------------------------------------------------------------  Cardiac Enzymes Recent Labs  Lab 09/23/18 1127  TROPONINI 0.03*   ------------------------------------------------------------------------------------------------------------------  RADIOLOGY:  Ct Head Wo Contrast  Result Date: 09/23/2018 CLINICAL DATA:  Vision loss left eye.  Severe headache 3 days EXAM: CT HEAD WITHOUT CONTRAST TECHNIQUE: Contiguous axial images were obtained from the base of the skull through the vertex without intravenous contrast. COMPARISON:  CT head 04/12/2011 FINDINGS: Brain: Hypodensity right occipital lobe most compatible with acute/subacute infarct. No hemorrhage. Ventricle size normal. Mild chronic white matter changes. No midline shift or mass. Vascular: Negative for hyperdense vessel Skull: Negative Sinuses/Orbits: Negative Other: None IMPRESSION: Hypodensity right occipital lobe compatible with acute or subacute infarct. Negative for hemorrhage. These results were  called by telephone at the time of interpretation on 09/23/2018 at 11:39 am to Dr. Lavonia Drafts , who verbally acknowledged these results. Electronically Signed   By: Franchot Gallo M.D.   On: 09/23/2018 11:40    EKG:  Normal sinus rhythm no ST elevation or depression Left bundle branch block  IMPRESSION AND PLAN:   77 year old female with diabetes who presents with left eye peripheral visual loss.  1. Hypodensity right occipital lobe compatible with acute or subacute infarct: Continue CVA work-up with echocardiogram, carotid Doppler, MRI/MRA Neurology  consultation requested in case discussed with a neurologist. PT, OT and speech consultation requested Start aspirin and continue statin Check lipid panel and A1c Further recommendations after work-up and neurology consultation.  2.  Diabetes: Diabetes nurse consultation Sliding scale with ADA diet Check A1c 3.  Depression: Continue Zoloft  4.  Hyperlipidemia: Continue statin and check lipid panel  All the records are reviewed and case discussed with ED provider. Management plans discussed with the patient and she is in agreement  CODE STATUS: FULL  TOTAL TIME TAKING CARE OF THIS PATIENT: 45 minutes.    Willis Holquin M.D on 09/23/2018 at 12:23 PM  Between 7am to 6pm - Pager - [phone]  After 6pm go to [email or web address] - password [email or web address] [facility]  Office  [phone]  CC: Primary care physician; Maryland Pink, MD

## 2018-09-23 NOTE — Progress Notes (Signed)
Chaplain responded to an OR for an AD. Pt's sister and granddaughter was at the bedside. Chaplain educated Pt on AD and was called for an RR. No questions from the Pt.    09/23/18 1300  Clinical Encounter Type  Visited With Patient and family together  Visit Type Initial  Referral From Physician  Spiritual Encounters  Spiritual Needs Brochure

## 2018-09-23 NOTE — Consult Note (Signed)
Referring Physician: Mody Sital    Chief Complaint: Peripheral vision loss on the left  HPI: April [name] is an 77 y.o. female  With past medical history of diabetes mellitus without complications, hyperlipidemia, hypertension, and vitamin B12 deficiency presenting to the ED 09/23/2018 with complaints left vision field deficit. Patient states that she went to bed on Sunday with a slight headache and woke up in the morning with flashing in her left eye. She did not realized that she had lost peripheral vision on the left until she started bumping into objects. Patient describes episode as painless loss of vision in the left eye without associated vertigo/dizziness. No symptoms of eye redness or pain and tearing associated with visual loss (intermittent angle closure glaucoma). Patient states nothing seem to precipitate episode such as postural changes or exercise, loss of vision when eyes are moved into certain positions of gaze (gaze-evoked amaurosis) or loss of vision after exercise or a hot shower (Uhthoff's symptom) to suggest demyelinating disease of the optic nerve. Denies associated altered sensorium, speech abnormality, cranial nerve deficit, seizures, focal motor or sensory deficits, diplopia, nausea or vomiting, ipsilateral or contralateral paralysis/weakness, numbness or tingling, involuntary movements, tremor. He denies history of head injury, recent trauma or infection.  NIH stroke scale on presentation was 1.  initial CT head showed hypodensity in the right occipital lobe compatible with acute or subacute infarct.  Date last known well: Date: 09/22/2018 Time last known well: Unable to determine tPA Given: No: outside window period.  Past Medical History:  Diagnosis Date  . B12 deficiency   . Cataracts, both eyes   . Diabetes mellitus without complication ([place])   . Dyslipidemia   . Hypertension     Past Surgical History:  Procedure Laterality Date  . CATARACT EXTRACTION    .  COLONOSCOPY    . COLONOSCOPY WITH PROPOFOL N/A 07/04/2015   Procedure: COLONOSCOPY WITH PROPOFOL;  Surgeon: Josefine Class, MD;  Location: [facility] ENDOSCOPY;  Service: Endoscopy;  Laterality: N/A;  . EYE SURGERY      Family History  Problem Relation Age of Onset  . Breast cancer Sister 40   Social History:  reports that she has quit smoking. She has never used smokeless tobacco. She reports that she does not drink alcohol or use drugs.  Allergies:  Allergies  Allergen Reactions  . Crestor [Rosuvastatin Calcium]   . Lipitor [Atorvastatin]   . Liraglutide   . Sulfa Antibiotics   . Voltaren [Diclofenac Sodium] Nausea Only  . Zocor [Simvastatin] Other (See Comments)    Muscle pain    Medications:  I have reviewed the patient's current medications. Prior to Admission:  Medications Prior to Admission  Medication Sig Dispense Refill Last Dose  . cyanocobalamin (,VITAMIN B-12,) 1000 MCG/ML injection Inject 1,000 mcg into the muscle every 30 (thirty) days.   Past Month at Unknown time  . glipiZIDE (GLUCOTROL) 10 MG tablet Take 10 mg by mouth daily before breakfast.   09/22/2018 at 0800  . lisinopril (PRINIVIL,ZESTRIL) 5 MG tablet Take 5 mg by mouth daily.   09/22/2018 at 0800  . meloxicam (MOBIC) 15 MG tablet Take 15 mg by mouth daily.   09/22/2018 at 0800  . metFORMIN (GLUCOPHAGE) 500 MG tablet Take by mouth 2 (two) times daily with a meal.   09/22/2018 at 2100  . pravastatin (PRAVACHOL) 80 MG tablet Take 80 mg by mouth daily.   09/22/2018 at 0800  . sertraline (ZOLOFT) 50 MG tablet Take 50 mg by  mouth daily.   09/22/2018 at 0800  . SitaGLIPtin-MetFORMIN HCl 50-500 MG TB24 Take by mouth.   09/22/2018 at 0800  . amoxicillin-clavulanate (AUGMENTIN) 875-125 MG tablet Take 1 tablet by mouth 2 (two) times daily. (Patient not taking: Reported on 09/23/2018) 20 tablet 0 Not Taking at Unknown time  . ibuprofen (ADVIL) 200 MG tablet Take 2 tablets (400 mg total) by mouth every 8 (eight) hours as needed  for moderate pain. (Patient not taking: Reported on 09/23/2018) 30 tablet 0 Not Taking at Unknown time  . traMADol (ULTRAM) 50 MG tablet Take 1 tablet (50 mg total) by mouth every 12 (twelve) hours as needed for moderate pain. (Patient not taking: Reported on 09/23/2018) 12 tablet 0 Not Taking at Unknown time   Scheduled: .  stroke: mapping our early stages of recovery book   Does not apply Once  . aspirin  300 mg Rectal Daily   Or  . aspirin  325 mg Oral Daily  . enoxaparin (LOVENOX) injection  40 mg Subcutaneous Q24H  . insulin aspart  0-15 Units Subcutaneous TID WC  . insulin aspart  0-5 Units Subcutaneous QHS  . insulin glargine  10 Units Subcutaneous QHS  . pravastatin  80 mg Oral Daily  . sertraline  50 mg Oral Daily    ROS: History obtained from the patient   General ROS: negative for - chills, fatigue, fever, night sweats, weight gain or weight loss Psychological ROS: negative for - behavioral disorder, hallucinations, memory difficulties, mood swings or suicidal ideation Ophthalmic ROS: negative for - blurry vision, double vision, eye pain or loss of vision ENT ROS: negative for - epistaxis, nasal discharge, oral lesions, sore throat, tinnitus or vertigo Allergy and Immunology ROS: negative for - hives or itchy/watery eyes Hematological and Lymphatic ROS: negative for - bleeding problems, bruising or swollen lymph nodes Endocrine ROS: negative for - galactorrhea, hair pattern changes, polydipsia/polyuria or temperature intolerance Respiratory ROS: negative for - cough, hemoptysis, shortness of breath or wheezing Cardiovascular ROS: negative for - chest pain, dyspnea on exertion, edema or irregular heartbeat Gastrointestinal ROS: negative for - abdominal pain, diarrhea, hematemesis, nausea/vomiting or stool incontinence Genito-Urinary ROS: negative for - dysuria, hematuria, incontinence or urinary frequency/urgency Musculoskeletal ROS: negative for - joint swelling or muscular  weakness Neurological ROS: as noted in HPI Dermatological ROS: negative for rash and skin lesion changes  Physical Examination: Blood pressure (!) 162/72, pulse 85, temperature 98.6 F (37 C), temperature source Oral, resp. rate 20, height 5\' 2"  (1.575 m), weight 114.8 kg, SpO2 100 %.   HEENT-  Normocephalic, no lesions, without obvious abnormality.  Normal external eye and conjunctiva.  Normal TM's bilaterally.  Normal auditory canals and external ears. Normal external nose, mucus membranes and septum.  Normal pharynx. Cardiovascular- S1, S2 normal, pulses palpable throughout   Lungs- chest clear, no wheezing, rales, normal symmetric air entry Abdomen- soft, non-tender; bowel sounds normal; no masses,  no organomegaly Extremities- no edema Lymph-no adenopathy palpable Musculoskeletal-no joint tenderness, deformity or swelling Skin-warm and dry, no hyperpigmentation, vitiligo, or suspicious lesions  Neurological Exam   Mental Status: Alert, oriented, thought content appropriate.  Speech fluent without evidence of aphasia.  Able to follow 3 step commands without difficulty. Attention span and concentration seemed appropriate  Cranial Nerves: II: Discs flat bilaterally; left homonymous hemianopsia, pupils equal, round, reactive to light and accommodation III,IV, VI: ptosis not present, extra-ocular motions intact bilaterally V,VII: smile symmetric, facial light touch sensation intact VIII: hearing normal bilaterally IX,X: gag  reflex present XI: bilateral shoulder shrug XII: midline tongue extension Motor: Right :  Upper extremity   5/5 Without pronator drift      Left: Upper extremity   5/5 without pronator drift Right:   Lower extremity   5/5                                          Left: Lower extremity   5/5 Tone and bulk:normal tone throughout; no atrophy noted Sensory: Pinprick and light touch intact bilaterally Deep Tendon Reflexes: 2+ and symmetric throughout Plantars: Right:  mute                              Left: mute Cerebellar: Finger-to-nose testing intact bilaterally. Heel to shin testing normal bilaterally Gait: not tested due to safety concerns  Data Reviewed  Laboratory Studies:  Basic Metabolic Panel: Recent Labs  Lab 09/23/18 1127  NA 137  K 4.9  CL 101  CO2 27  GLUCOSE 394*  BUN 23  CREATININE 1.06*  CALCIUM 9.5    Liver Function Tests: Recent Labs  Lab 09/23/18 1127  AST 18  ALT 15  ALKPHOS 60  BILITOT 0.7  PROT 6.9  ALBUMIN 3.8   No results for input(s): LIPASE, AMYLASE in the last 168 hours. No results for input(s): AMMONIA in the last 168 hours.  CBC: Recent Labs  Lab 09/23/18 1127  WBC 7.9  NEUTROABS 5.6  HGB 12.9  HCT 40.4  MCV 90.6  PLT 223    Cardiac Enzymes: Recent Labs  Lab 09/23/18 1127  TROPONINI 0.03*    BNP: Invalid input(s): POCBNP  CBG: No results for input(s): GLUCAP in the last 168 hours.  Microbiology: No results found for this or any previous visit.  Coagulation Studies: Recent Labs    09/23/18 1127  LABPROT 12.6  INR 0.95    Urinalysis: No results for input(s): COLORURINE, LABSPEC, PHURINE, GLUCOSEU, HGBUR, BILIRUBINUR, KETONESUR, PROTEINUR, UROBILINOGEN, NITRITE, LEUKOCYTESUR in the last 168 hours.  Invalid input(s): APPERANCEUR  Lipid Panel: No results found for: CHOL, TRIG, HDL, CHOLHDL, VLDL, LDLCALC  HgbA1C: No results found for: HGBA1C  Urine Drug Screen:  No results found for: LABOPIA, COCAINSCRNUR, LABBENZ, AMPHETMU, THCU, LABBARB  Alcohol Level: No results for input(s): ETH in the last 168 hours.  Other results: EKG: normal EKG, normal sinus rhythm, unchanged from previous tracings.  Imaging: Ct Head Wo Contrast  Result Date: 09/23/2018 CLINICAL DATA:  Vision loss left eye.  Severe headache 3 days EXAM: CT HEAD WITHOUT CONTRAST TECHNIQUE: Contiguous axial images were obtained from the base of the skull through the vertex without intravenous contrast.  COMPARISON:  CT head 04/12/2011 FINDINGS: Brain: Hypodensity right occipital lobe most compatible with acute/subacute infarct. No hemorrhage. Ventricle size normal. Mild chronic white matter changes. No midline shift or mass. Vascular: Negative for hyperdense vessel Skull: Negative Sinuses/Orbits: Negative Other: None IMPRESSION: Hypodensity right occipital lobe compatible with acute or subacute infarct. Negative for hemorrhage. These results were called by telephone at the time of interpretation on 09/23/2018 at 11:39 am to Dr. Lavonia Drafts , who verbally acknowledged these results. Electronically Signed   By: Franchot Gallo M.D.   On: 09/23/2018 11:40   US Carotid Bilateral (at [facility] And [facility] Only)  Result Date: 09/23/2018 CLINICAL DATA:  Stroke EXAM: BILATERAL CAROTID DUPLEX ULTRASOUND TECHNIQUE:  Gray scale imaging, color Doppler and duplex ultrasound were performed of bilateral carotid and vertebral arteries in the neck. COMPARISON:  None. FINDINGS: Criteria: Quantification of carotid stenosis is based on velocity parameters that correlate the residual internal carotid diameter with NASCET-based stenosis levels, using the diameter of the distal internal carotid lumen as the denominator for stenosis measurement. The following velocity measurements were obtained: RIGHT ICA: 118 cm/sec CCA: 84 cm/sec SYSTOLIC ICA/CCA RATIO:  1.4 ECA: 75 cm/sec LEFT ICA: 91 cm/sec CCA: 74 cm/sec SYSTOLIC ICA/CCA RATIO:  1.2 ECA: 116 cm/sec RIGHT CAROTID ARTERY: Mild intimal thickening in the bulb. Low resistance internal carotid Doppler pattern. RIGHT VERTEBRAL ARTERY:  Antegrade. LEFT CAROTID ARTERY: Mild intimal thickening in the bulb with a tiny focal calcification. Low resistance internal carotid Doppler pattern. LEFT VERTEBRAL ARTERY:  Antegrade. IMPRESSION: Less than 50% stenosis in the right and left internal carotid arteries. Electronically Signed   By: Marybelle Killings M.D.   On: 09/23/2018 15:37    Assessment: 77 y.o.  female with past medical history of diabetes mellitus without complications, hyperlipidemia, hypertension, and vitamin B12 deficiency presenting to the ED 09/23/2018 with complaints left vision field deficit. CT head showed hypodensity in the right occipital lobe compatible with acute or subacute infarct.  Probable course of left homonymous hemianopsia.  Concerns for posterior circulation stroke.  Patient states she was not on antiplatelet or anticoagulant prior to this event.  Stroke Risk Factors - diabetes mellitus, family history, hyperlipidemia and hypertension  Plan: 1. HgbA1c, fasting lipid panel 2. MRI  of the brain without contrast 3. CTA head and neck w/wo contrast 4. PT consult, OT consult, Speech consult 5. Echocardiogram 5. Carotid dopplers 6. Prophylactic therapy-Antiplatelet med: Aspirin - dose 81 mg/day 7. NPO until RN stroke swallow screen 8. Telemetry monitoring 9. Frequent neuro checks  This patient was staffed with Dr. Babette Relic, [facility] who personally evaluated patient, reviewed documentation and agreed with assessment and plan of care as above.  Rufina Falco, DNP, FNP-BC Board certified Nurse Practitioner Neurology Department   09/23/2018, 5:19 PM

## 2018-09-23 NOTE — Progress Notes (Signed)
Inpatient Diabetes Program Recommendations  AACE/ADA: New Consensus Statement on Inpatient Glycemic Control (2019)  Target Ranges:  Prepandial:   less than 140 mg/dL      Peak postprandial:   less than 180 mg/dL (1-2 hours)      Critically ill patients:  140 - 180 mg/dL   Results for April [name], April [name] (MRN [id number]) as of 09/23/2018 14:28  Ref. Range 09/23/2018 11:27  Glucose Latest Ref Range: 70 - 99 mg/dL 394 (H)   Review of Glycemic Control  Diabetes history: DM2 Outpatient Diabetes medications: Metformin 500 mg BID, Janumet 50-500 mg BID Current orders for Inpatient glycemic control: Novolog 0-15 units TID with meals, Novolog 0-5 units QHS  Inpatient Diabetes Program Recommendations: Insulin - Basal: Please consider ordering Lantus 10 units Q24H starting now. HgbA1C: Current A1C in process. Per Care Everywhere, A1C 11.4% on 08/14/18 at last office visit with Dr. Kary Kos.   NOTE: Noted consult. Chart reviewed. Per Care Everywhere, patient seen Dr. Kary Kos on 08/14/18 (that was first PCP visit in over a year) and per office note on 08/14/18 patient should be taking Glipizide 10 mg BID, Metformin 500 mg BID, Janumet 50-500 mg BID for DM control. Also noted A1C of 11.4% on 08/14/18 indicating an average glucose of 280 mg/dl. Current A1C in process. Will plan to follow up on A1C results and with patient tomorrow.  Thanks, Barnie Alderman, RN, MSN, CDE Diabetes Coordinator Inpatient Diabetes Program [phone] (Team Pager from 8am to 5pm)

## 2018-09-23 NOTE — ED Notes (Signed)
FIRST NURSE NOTE:  PT PRESENTS FOR BLURRED VISION. STARTED FRIDAY

## 2018-09-23 NOTE — Progress Notes (Signed)
OT Cancellation Note  Patient Details Name: April [name] MRN: [id number] DOB: [birth year]   Cancelled Treatment:    Reason Eval/Treat Not Completed: Medical issues which prohibited therapy. Order received, chart reviewed. MRI pending as part of stroke work up. Pt noted to have recent glucose of 394. Will hold OT evaluation this date and re-attempt at later date/time as medically appropriate and once imaging/testing has been completed.  Jeni Salles, MPH, MS, OTR/L ascom [phone] 09/23/18, 2:24 PM

## 2018-09-24 ENCOUNTER — Inpatient Hospital Stay: Payer: Medicare HMO

## 2018-09-24 ENCOUNTER — Inpatient Hospital Stay
Admit: 2018-09-24 | Discharge: 2018-09-24 | Disposition: A | Discharge status: Home / Self Care | Payer: Medicare HMO | Attending: Nurse Practitioner | Admitting: Nurse Practitioner

## 2018-09-24 LAB — GLUCOSE, CAPILLARY
Glucose-Capillary: 202 mg/dL — ABNORMAL HIGH (ref 70–99)
Glucose-Capillary: 260 mg/dL — ABNORMAL HIGH (ref 70–99)
Glucose-Capillary: 204 mg/dL — ABNORMAL HIGH (ref 70–99)
Glucose-Capillary: 191 mg/dL — ABNORMAL HIGH (ref 70–99)

## 2018-09-24 LAB — LIPID PANEL
Cholesterol: 175 mg/dL (ref 0–200)
HDL: 41 mg/dL (ref >40)
LDL Cholesterol: 101 mg/dL — ABNORMAL HIGH (ref 0–99)
Total CHOL/HDL Ratio: 4.3 RATIO
Triglycerides: 163 mg/dL — ABNORMAL HIGH (ref <150)
VLDL: 33 mg/dL (ref 0–40)

## 2018-09-24 LAB — HEMOGLOBIN A1C
Hgb A1c MFr Bld: 9.7 % — ABNORMAL HIGH (ref 4.8–5.6)
Mean Plasma Glucose: 231.69 mg/dL

## 2018-09-24 MED ORDER — LISINOPRIL 5 MG PO TABS
5.0000 mg | ORAL_TABLET | Freq: Every day | ORAL | Status: DC
  Administered 2018-09-25: 5 mg via ORAL
  Filled 2018-09-24: qty 1

## 2018-09-24 MED ORDER — ENOXAPARIN SODIUM 40 MG/0.4ML ~~LOC~~ SOLN
40.0000 mg | Freq: Two times a day (BID) | SUBCUTANEOUS | Status: DC
  Administered 2018-09-24 – 2018-09-25 (×3): 40 mg via SUBCUTANEOUS
  Filled 2018-09-24 (×3): qty 0.4

## 2018-09-24 MED ORDER — LISINOPRIL 5 MG PO TABS: 2.5000 mg | ORAL_TABLET | Freq: Every day | ORAL | Status: DC

## 2018-09-24 MED ORDER — ASPIRIN 81 MG PO CHEW
81.0000 mg | CHEWABLE_TABLET | Freq: Every day | ORAL | Status: DC
  Administered 2018-09-25: 81 mg via ORAL
  Filled 2018-09-24: qty 1

## 2018-09-24 MED ORDER — CARVEDILOL 3.125 MG PO TABS
3.1250 mg | ORAL_TABLET | Freq: Two times a day (BID) | ORAL | Status: DC
  Administered 2018-09-25: 09:00:00 3.125 mg via ORAL
  Filled 2018-09-24: qty 1

## 2018-09-24 NOTE — Progress Notes (Signed)
[place] [facility] [place] NAME: April [name]    MR#:  [id number]  DATE OF BIRTH:  29-Jan-1941  SUBJECTIVE:  CHIEF COMPLAINT:   Chief Complaint  Patient presents with  . Loss of Vision   Better left peripheral vision loss.  REVIEW OF SYSTEMS:  Review of Systems  Constitutional: Negative for chills, fever and malaise/fatigue.  HENT: Negative for sore throat.   Eyes: Negative for blurred vision and double vision.       Left peripheral vision loss.   Respiratory: Negative for cough, hemoptysis, shortness of breath, wheezing and stridor.   Cardiovascular: Negative for chest pain, palpitations, orthopnea and leg swelling.  Gastrointestinal: Negative for abdominal pain, blood in stool, diarrhea, melena, nausea and vomiting.  Genitourinary: Negative for dysuria, flank pain and hematuria.  Musculoskeletal: Negative for back pain and joint pain.  Skin: Negative for rash.  Neurological: Negative for dizziness, sensory change, focal weakness, seizures, loss of consciousness, weakness and headaches.  Endo/Heme/Allergies: Negative for polydipsia.  Psychiatric/Behavioral: Negative for depression. The patient is not nervous/anxious.     DRUG ALLERGIES:   Allergies  Allergen Reactions  . Crestor [Rosuvastatin Calcium]   . Lipitor [Atorvastatin]   . Liraglutide   . Sulfa Antibiotics   . Voltaren [Diclofenac Sodium] Nausea Only  . Zocor [Simvastatin] Other (See Comments)    Muscle pain   VITALS:  Blood pressure 139/68, pulse 73, temperature 98.4 F (36.9 C), temperature source Oral, resp. rate 17, height 5\' 2"  (1.575 m), weight 114.8 kg, SpO2 97 %. PHYSICAL EXAMINATION:  Physical Exam  Constitutional: She is oriented to person, place, and time. She appears well-developed.  Morbid obesity.  HENT:  Head: Normocephalic.  Mouth/Throat: Oropharynx is clear and moist.  Eyes: Pupils are equal, round, and reactive to light. Conjunctivae and EOM are  normal. No scleral icterus.  left peripheral vision loss.   Neck: Normal range of motion. Neck supple. No JVD present. No tracheal deviation present.  Cardiovascular: Normal rate, regular rhythm and normal heart sounds. Exam reveals no gallop.  No murmur heard. Pulmonary/Chest: Effort normal and breath sounds normal. No respiratory distress. She has no wheezes. She has no rales.  Abdominal: Soft. Bowel sounds are normal. She exhibits no distension. There is no tenderness. There is no rebound.  Musculoskeletal: Normal range of motion. She exhibits no edema or tenderness.  Neurological: She is alert and oriented to person, place, and time. No cranial nerve deficit.  Skin: No rash noted. No erythema.  Psychiatric: She has a normal mood and affect.   LABORATORY PANEL:  Female CBC Recent Labs  Lab 09/23/18 1127  WBC 7.9  HGB 12.9  HCT 40.4  PLT 223   ------------------------------------------------------------------------------------------------------------------ Chemistries  Recent Labs  Lab 09/23/18 1127  NA 137  K 4.9  CL 101  CO2 27  GLUCOSE 394*  BUN 23  CREATININE 1.06*  CALCIUM 9.5  AST 18  ALT 15  ALKPHOS 60  BILITOT 0.7   RADIOLOGY:  Ct Angio Head W Or Wo Contrast  Result Date: 09/23/2018 CLINICAL DATA:  77 y/o F; peripheral vision loss of the left eye. Headache. Evaluate stroke for follow-up. EXAM: CT ANGIOGRAPHY HEAD AND NECK TECHNIQUE: Multidetector CT imaging of the head and neck was performed using the standard protocol during bolus administration of intravenous contrast. Multiplanar CT image reconstructions and MIPs were obtained to evaluate the vascular anatomy. Carotid stenosis measurements (when applicable) are obtained utilizing NASCET criteria, using the distal internal  carotid diameter as the denominator. CONTRAST:  75 cc Omnipaque 350 COMPARISON:  09/23/2018 carotid ultrasound. 09/23/2018 CT head. 05/01/2011 CT chest. FINDINGS: CTA NECK FINDINGS Aortic arch:  Standard branching. Imaged portion shows no evidence of aneurysm or dissection. No significant stenosis of the major arch vessel origins. Mild calcific atherosclerosis. Right carotid system: No evidence of dissection, stenosis (50% or greater) or occlusion. Left carotid system: No evidence of dissection, stenosis (50% or greater) or occlusion. Vertebral arteries: Codominant. No evidence of dissection, stenosis (50% or greater) or occlusion. Skeleton: Mild cervical spondylosis. C5-6 ACDF postsurgical changes. Other neck: 15 mm calcified nodule within the left lobe of the thyroid gland (series 4, image 23). Upper chest: Few stable 2-3 mm nodules in the lung apices compatible benign etiology. Review of the MIP images confirms the above findings CTA HEAD FINDINGS Anterior circulation: No significant stenosis, proximal occlusion, aneurysm, or vascular malformation. 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 Otherwise no large vessel occlusion, aneurysm, or significant stenosis is identified. Venous sinuses: As permitted by contrast timing, patent. Anatomic variants: None significant. Delayed phase: No abnormal intracranial enhancement. Right occipital lobe infarction is stable in distribution from the prior CT of the head. Review of the MIP images confirms the above findings IMPRESSION: CTA neck: 1. Patent carotid and vertebral arteries. No dissection, aneurysm, or hemodynamically significant stenosis utilizing NASCET criteria. 2. Mild calcific atherosclerosis of aortic arch. 3. 15 mm calcified nodule within left lobe of thyroid gland. Further evaluation with thyroid ultrasound is recommended on a nonemergent basis. CTA head: 1. 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 2. Otherwise no large vessel occlusion, aneurysm, or significant stenosis is identified. Electronically Signed   By: Kristine Garbe  M.D.   On: 09/23/2018 21:41   Ct Angio Neck W Or Wo Contrast  Result Date: 09/23/2018 CLINICAL DATA:  77 y/o F; peripheral vision loss of the left eye. Headache. Evaluate stroke for follow-up. EXAM: CT ANGIOGRAPHY HEAD AND NECK TECHNIQUE: Multidetector CT imaging of the head and neck was performed using the standard protocol during bolus administration of intravenous contrast. Multiplanar CT image reconstructions and MIPs were obtained to evaluate the vascular anatomy. Carotid stenosis measurements (when applicable) are obtained utilizing NASCET criteria, using the distal internal carotid diameter as the denominator. CONTRAST:  75 cc Omnipaque 350 COMPARISON:  09/23/2018 carotid ultrasound. 09/23/2018 CT head. 05/01/2011 CT chest. FINDINGS: CTA NECK FINDINGS Aortic arch: Standard branching. Imaged portion shows no evidence of aneurysm or dissection. No significant stenosis of the major arch vessel origins. Mild calcific atherosclerosis. Right carotid system: No evidence of dissection, stenosis (50% or greater) or occlusion. Left carotid system: No evidence of dissection, stenosis (50% or greater) or occlusion. Vertebral arteries: Codominant. No evidence of dissection, stenosis (50% or greater) or occlusion. Skeleton: Mild cervical spondylosis. C5-6 ACDF postsurgical changes. Other neck: 15 mm calcified nodule within the left lobe of the thyroid gland (series 4, image 23). Upper chest: Few stable 2-3 mm nodules in the lung apices compatible benign etiology. Review of the MIP images confirms the above findings CTA HEAD FINDINGS Anterior circulation: No significant stenosis, proximal occlusion, aneurysm, or vascular malformation. 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 Otherwise no large vessel occlusion, aneurysm, or significant stenosis is identified. Venous sinuses: As permitted by contrast timing, patent. Anatomic variants: None  significant. Delayed phase: No abnormal intracranial enhancement. Right occipital lobe infarction is stable  in distribution from the prior CT of the head. Review of the MIP images confirms the above findings IMPRESSION: CTA neck: 1. Patent carotid and vertebral arteries. No dissection, aneurysm, or hemodynamically significant stenosis utilizing NASCET criteria. 2. Mild calcific atherosclerosis of aortic arch. 3. 15 mm calcified nodule within left lobe of thyroid gland. Further evaluation with thyroid ultrasound is recommended on a nonemergent basis. CTA head: 1. 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 2. Otherwise no large vessel occlusion, aneurysm, or significant stenosis is identified. Electronically Signed   By: Kristine Garbe M.D.   On: 09/23/2018 21:41   Mr Brain Wo Contrast  Result Date: 09/24/2018 CLINICAL DATA:  LEFT vision changes. Headache. History of diabetes and hypertension. EXAM: MRI HEAD WITHOUT CONTRAST TECHNIQUE: Multiplanar, multiecho pulse sequences of the brain and surrounding structures were obtained without intravenous contrast. COMPARISON:  CT HEAD September 23, 2018 FINDINGS: INTRACRANIAL CONTENTS: Confluent RIGHT mesial temporal occipital and additional patchy RIGHT parietooccipital reduced diffusion with normalized ADC values, bright FLAIR T2 hyperintense signal and speckled susceptibility artifact compatible with petechial hemorrhage. Scattered subcentimeter supratentorial white matter FLAIR T2 hyperintensities. The ventricles and sulci are normal for patient's age. No suspicious parenchymal signal, masses, mass effect. No abnormal extra-axial fluid collections. No extra-axial masses. VASCULAR: Normal major intracranial vascular flow voids present at skull base. SKULL AND UPPER CERVICAL SPINE: No abnormal sellar expansion. No suspicious calvarial bone marrow signal. Craniocervical junction maintained. SINUSES/ORBITS: The mastoid air-cells and  included paranasal sinuses are well-aerated.The included ocular globes and orbital contents are non-suspicious. Status post RIGHT ocular lens implants. OTHER: None. IMPRESSION: 1. 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 2. Mild chronic small vessel ischemic changes. Electronically Signed   By: Elon Alas M.D.   On: 09/24/2018 03:32   ASSESSMENT AND PLAN:   77 year old female with diabetes who presents with left eye peripheral visual loss.  1. 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 Continue aspirin and Pravachol. CTA  1. 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 2. Otherwise no large vessel occlusion, aneurysm, or significant stenosis is identified. Echo: ThisLV EF: 20% -   62%, severe systolic dysfunction.  Carotid duplex is unremarkable.  2.    Hyperglycemia due to diabetes type II:  Increased Lantus to 15 units at bedtime, continue sliding scale with ADA diet Check A1c 9.7.  Per diabetes coordinator, increasing metformin to 1000 mg twice daily as outpatient.  Follow-up with PCP for restarting low dose of Victoza.  3.  Depression: Continue Zoloft  4.  Hyperlipidemia: LDL 101. Continue statin.  Hypertension.  Start low-dose Coreg and continue lisinopril due to severely reduced systolic dysfunction.  Morbid obesity.  Diet control and exercise, follow-up lipid panel in 3 months with PCP.  Discussed with neurology PA. All the records are reviewed and case discussed with Care Management/Social Worker. Management plans discussed with the patient, her daughter and they are in agreement.  CODE STATUS: Full Code  TOTAL TIME TAKING CARE OF THIS PATIENT: 33 minutes.   More than 50% of the time was spent in counseling/coordination of care: YES  POSSIBLE D/C IN 1-2 DAYS, DEPENDING ON CLINICAL CONDITION.   Demetrios Loll M.D on 09/24/2018 at  5:23 PM  Between 7am to 6pm - Pager - [phone]  After 6pm go to [email or web address] - [facility]

## 2018-09-24 NOTE — Progress Notes (Signed)
SLP Cancellation Note  Patient Details Name: April [name] MRN: [id number] DOB: Jan 17, 1941   Cancelled treatment:       Reason Eval/Treat Not Completed: SLP screened, no needs identified, will sign off(chart reviewed; consulted NSG then met w/ pt in room). Pt denied any difficulty swallowing and is currently on a regular diet; tolerates swallowing pills w/ water per NSG. Pt conversed at conversational level w/out deficits noted; pt and friend denied any speech-language deficits.  No further skilled ST services indicated as pt appears at her baseline. Pt agreed. NSG to reconsult if any change in status.      Orinda Kenner, MS, CCC-SLP [facility] 09/24/2018, 9:41 AM

## 2018-09-24 NOTE — Evaluation (Signed)
Physical Therapy Evaluation Patient Details Name: April [name] MRN: [id number] DOB: October 20, 1941 Today's Date: 09/24/2018   History of Present Illness  Pt is a 77 y.o. female presenting to hospital 09/23/18 with L sided vision loss Monday morning.  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  PMH includes DM, htn.  Clinical Impression  Prior to hospital admission, pt was independent and driving.  Pt lives alone in 1 level home with 3 steps to enter with R railing.  Currently pt is modified independent with bed mobility; SBA with transfers; CGA to SBA with ambulation 150 feet (no AD); and CGA navigating 4 stairs with railing.  Visual testing performed and impaired vision to L side in B eyes noted when testing peripheral vision (pt able to see objects on L when turning head and/or eyes to L side).  Pt did well compensating for visual impairments with functional mobility.  Education performed for additional compensatory visual strategies: pt verbalizing good understanding.  Pt would benefit from skilled PT to address noted impairments and functional limitations during hospital stay (see below for any additional details).  Upon hospital discharge, recommend pt discharge to home; no further PT services anticipated (OT reports recommendation for OP OT--OP low vision OT services).    Follow Up Recommendations No PT follow up    Equipment Recommendations  None recommended by PT    Recommendations for Other Services OT consult     Precautions / Restrictions Precautions Precautions: Fall Restrictions Weight Bearing Restrictions: No      Mobility  Bed Mobility Overal bed mobility: Modified Independent             General bed mobility comments: Semi-supine to/from sit without any noted difficulties.  Transfers Overall transfer level: Needs assistance Equipment used: None Transfers: Sit to/from Stand Sit to Stand: Supervision          General transfer comment: steady strong transfers noted  Ambulation/Gait Ambulation/Gait assistance: Min guard;Supervision Gait Distance (Feet): 150 Feet Assistive device: None   Gait velocity: decreased   General Gait Details: increased BOS; mild antalgic gait (decreased stance time R LE: pt reports h/o arthritis issues and was baseline for her)  Stairs Stairs: Yes Stairs assistance: Min guard Stair Management: One rail Right;Step to pattern;Forwards Number of Stairs: 4 General stair comments: antalgic d/t R knee pain with stairs navigation (pt reports this is typical for her) but overall steady  Wheelchair Mobility    Modified Rankin (Stroke Patients Only)       Balance Overall balance assessment: Needs assistance Sitting-balance support: No upper extremity supported;Feet supported Sitting balance-Leahy Scale: Normal Sitting balance - Comments: steady sitting reaching outside BOS   Standing balance support: No upper extremity supported;During functional activity Standing balance-Leahy Scale: Normal Standing balance comment: no loss of balance with ambulation and head turns R/L/up/down                             Pertinent Vitals/Pain Pain Assessment: No/denies pain  Vitals (HR and O2 on room air) stable and WFL throughout treatment session.    Home Living Family/patient expects to be discharged to:: Private residence Living Arrangements: Alone Available Help at Discharge: Family;Available PRN/intermittently Type of Home: House Home Access: Stairs to enter Entrance Stairs-Rails: Right Entrance Stairs-Number of Steps: 3 Home Layout: One level Home Equipment: None      Prior Function Level of Independence: Independent  Comments: Pt was driving; independent with all ADL, IADL tasks and mobility. 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     Hand Dominance   Dominant Hand: Right    Extremity/Trunk Assessment   Upper  Extremity Assessment Upper Extremity Assessment: Defer to OT evaluation    Lower Extremity Assessment Lower Extremity Assessment: (B LE strength, ROM, tone, sensation, proprioception, and coordination intact.)    Cervical / Trunk Assessment Cervical / Trunk Assessment: Normal  Communication   Communication: No difficulties  Cognition Arousal/Alertness: Awake/alert Behavior During Therapy: WFL for tasks assessed/performed Overall Cognitive Status: Within Functional Limits for tasks assessed                                        General Comments General comments (skin integrity, edema, etc.): R knee appearing swollen compared to L knee (pt reports this is baseline for her d/t "arthritis").  Nursing cleared pt for participation in physical therapy.  Pt agreeable to PT session.  Pt's friend and sister present during session.    Exercises    Assessment/Plan    PT Assessment Patient needs continued PT services  PT Problem List Other (comment)(Visual impairments )       PT Treatment Interventions Gait training;Stair training;Functional mobility training;Therapeutic activities;Therapeutic exercise;Balance training;Patient/family education;Neuromuscular re-education    PT Goals (Current goals can be found in the Care Plan section)  Acute Rehab PT Goals Patient Stated Goal: to go home PT Goal Formulation: With patient Time For Goal Achievement: 10/08/18 Potential to Achieve Goals: Good    Frequency 7X/week   Barriers to discharge        Co-evaluation               AM-PAC PT "6 Clicks" Mobility  Outcome Measure Help needed turning from your back to your side while in a flat bed without using bedrails?: None Help needed moving from lying on your back to sitting on the side of a flat bed without using bedrails?: None Help needed moving to and from a bed to a chair (including a wheelchair)?: A Little Help needed standing up from a chair using your arms  (e.g., wheelchair or bedside chair)?: A Little Help needed to walk in hospital room?: A Little Help needed climbing 3-5 steps with a railing? : A Little 6 Click Score: 20    End of Session Equipment Utilized During Treatment: Gait belt Activity Tolerance: Patient tolerated treatment well Patient left: in bed;with call bell/phone within reach;with bed alarm set;with family/visitor present Nurse Communication: Mobility status;Precautions;Other (comment)(Pt's visual impairments) PT Visit Diagnosis: Other abnormalities of gait and mobility (R26.89);Muscle weakness (generalized) (M62.81)    Time: 1610-9604 PT Time Calculation (min) (ACUTE ONLY): 24 min   Charges:   PT Evaluation $PT Eval Low Complexity: 1 Low PT Treatments $Therapeutic Activity: 8-22 mins        Leitha Bleak, PT 09/24/18, 3:23 PM [phone]

## 2018-09-24 NOTE — Evaluation (Signed)
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 PMH includes depression and DM.    Clinical Impression   Pt seen for OT evaluation this date. Pt presented in bed with a room full of family/friends. Pt lives alone but has friends/family able to assist as needed upon pt's return home. Pt was independent with mobility, ADL, and IADL prior to admission with no falls reported in past 12 months. Pt currently presents with L visual field deficits (50% vision loss on L side in B eyes) which can impact his functional mobility, safety, and ability to perform ADL and IADL tasks at [facility]. Pt/family educated in symptoms and compensatory strategies for homonymous hemianopsia including visual tracking, visual scanning exercises, safety awareness, falls prevention, navigating the environment, strategies for reading, and community mobility. Handout provided. Pt/family verbalized understanding. Pt at supervision level for mobility during assessment, good safety awareness, strength and ROM WNL, intact sensation and coordination, and no neglect to R side upon assessment.  Encouraged pt to seek OP low vision OT services upon discharge. No additional acute skilled OT needs at this time. Will sign off. Please re-consult if additional needs arise.    Follow Up Recommendations  Outpatient OT    Equipment Recommendations  None recommended by OT    Recommendations for Other Services Other (comment)(low vision OT specialist)     Precautions / Restrictions Precautions Precautions: Fall Restrictions Weight Bearing Restrictions: No      Mobility Bed Mobility Overal bed mobility: Independent                Transfers Overall transfer level: Needs assistance   Transfers: Sit to/from Stand Sit to Stand: Supervision              Balance  Overall balance assessment: Modified Independent                                         ADL either performed or assessed with clinical judgement   ADL Overall ADL's : Needs assistance/impaired                                       General ADL Comments: Pt at Supervision level for all ADL tasks.      Vision Baseline Vision/History: Wears glasses Wears Glasses: Reading only Patient Visual Report: Peripheral vision impairment Vision Assessment?: Yes Ocular Range of Motion: Within Functional Limits Tracking/Visual Pursuits: Able to track stimulus in all quads without difficulty Convergence: Within functional limits Visual Fields: Left homonymous hemianopsia Additional Comments: Pt states vision in L eye is blurrier than before the CVA. L eye peripheral vision midline to right. Pt left with handout of HH.      Perception     Praxis      Pertinent Vitals/Pain Pain Assessment: No/denies pain     Hand Dominance Right   Extremity/Trunk Assessment Upper Extremity Assessment Upper Extremity Assessment: Overall WFL for tasks assessed(5/5 grip, shoulder and bi/tricep strength)   Lower Extremity Assessment Lower Extremity Assessment: Overall WFL for tasks assessed       Communication Communication Communication: No difficulties   Cognition Arousal/Alertness: Awake/alert Behavior During Therapy: WFL for tasks assessed/performed Overall  Cognitive Status: Within Functional Limits for tasks assessed                                     General Comments       Exercises Other Exercises Other Exercises: Pt/family educated in symptoms and compensatory strategies for homonymous hemianopsia including visual tracking, visual scanning exercises, safety awareness, falls prevention, navigating the environment, strategies for reading, and community mobility. Handout provided.   Shoulder Instructions      Home Living Family/patient  expects to be discharged to:: Private residence Living Arrangements: Alone Available Help at Discharge: Family;Available PRN/intermittently Type of Home: House Home Access: Stairs to enter [employer] of Steps: 3   Home Layout: One level     Bathroom Shower/Tub: Teacher, early years/pre: Standard     Home Equipment: None          Prior Functioning/Environment Level of Independence: Independent        Comments: Pt was driving; independent with all ADL, IADL tasks and mobility. Pt sews costumes for theater production.        OT Problem List: Impaired vision/perception;Decreased safety awareness      OT Treatment/Interventions: Visual/perceptual remediation/compensation    OT Goals(Current goals can be found in the care plan section) Acute Rehab OT Goals Patient Stated Goal: get back to work OT Goal Formulation: All assessment and education complete, DC therapy Time For Goal Achievement: 10/08/18 Potential to Achieve Goals: Good  OT Frequency: Min 1X/week   Barriers to D/C:            Co-evaluation              AM-PAC OT "6 Clicks" Daily Activity     Outcome Measure Help from another person eating meals?: None Help from another person taking care of personal grooming?: None Help from another person toileting, which includes using toliet, bedpan, or urinal?: None Help from another person bathing (including washing, rinsing, drying)?: None Help from another person to put on and taking off regular upper body clothing?: None Help from another person to put on and taking off regular lower body clothing?: None 6 Click Score: 24   End of Session Equipment Utilized During Treatment: Gait belt  Activity Tolerance: Patient tolerated treatment well Patient left: in bed;with call bell/phone within reach;with family/visitor present;with bed alarm set  OT Visit Diagnosis: Low vision, both eyes (H54.2)                Time: 0160-1093 OT Time  Calculation (min): 41 min Charges:     Jadene Pierini OTS  09/24/2018, 1:21 PM

## 2018-09-24 NOTE — Plan of Care (Signed)
  Problem: Education: Goal: Knowledge of secondary prevention will improve Outcome: Progressing Goal: Knowledge of patient specific risk factors addressed and post discharge goals established will improve Outcome: Progressing   Problem: Coping: Goal: Will verbalize positive feelings about self Outcome: Progressing   Problem: Health Behavior/Discharge Planning: Goal: Ability to manage health-related needs will improve Outcome: Progressing   Problem: Self-Care: Goal: Ability to participate in self-care as condition permits will improve Outcome: Progressing   Problem: Nutrition: Goal: Risk of aspiration will decrease Outcome: Progressing   Problem: Ischemic Stroke/TIA Tissue Perfusion: Goal: Complications of ischemic stroke/TIA will be minimized Outcome: Progressing

## 2018-09-24 NOTE — Progress Notes (Signed)
Anticoagulation monitoring(Lovenox):  77yo  F ordered Lovenox 40 mg Q24h  Filed Weights   09/23/18 1114  Weight: 253 lb (114.8 kg)   BMI 46.26   Lab Results  Component Value Date   CREATININE 1.06 (H) 09/23/2018   CREATININE 0.77 04/18/2011   Estimated Creatinine Clearance: 55 mL/min (A) (by C-G formula based on SCr of 1.06 mg/dL (H)). Hemoglobin & Hematocrit     Component Value Date/Time   HGB 12.9 09/23/2018 1127   HCT 40.4 09/23/2018 1127     Per Protocol for Patient with estCrcl > 30 ml/min and BMI > 40, will transition to Lovenox 40 mg Q12h.      Chinita Greenland PharmD Clinical Pharmacist 09/24/2018

## 2018-09-24 NOTE — Progress Notes (Signed)
Subjective: No new strokes or stroke like symptoms reported. Patient state that flickering lights in her left eye has improved. She still has visual field deficit on the left.  Objective: Current vital signs: BP 139/68 (BP Location: Right Arm)   Pulse 73   Temp 98.4 F (36.9 C) (Oral)   Resp 17   Ht 5\' 2"  (1.575 m)   Wt 114.8 kg   SpO2 97%   BMI 46.27 kg/m  Vital signs in last 24 hours: Temp:  [97.8 F (36.6 C)-100.6 F (38.1 C)] 98.4 F (36.9 C) (12/04 0814) Pulse Rate:  [73-89] 73 (12/04 0814) Resp:  [17-20] 17 (12/04 0814) BP: (127-140)/(52-86) 139/68 (12/04 0814) SpO2:  [96 %-97 %] 97 % (12/04 0814) Weight:  [114.8 kg] 114.8 kg (12/04 0228)  Intake/Output from previous day: No intake/output data recorded. Intake/Output this shift: No intake/output data recorded. Nutritional status:  Diet Order            Diet Carb Modified Fluid consistency: Thin; Room service appropriate? Yes  Diet effective now             Neurological Exam  Mental Status: Alert, oriented, thought content appropriate. Speech fluent without evidence of aphasia. Able to follow 3 step commands without difficulty. Attention span and concentration seemed appropriate  Cranial Nerves: II: Discs flat bilaterally; left homonymous hemianopsia, pupils equal, round, reactive to light and accommodation III,IV, VI: ptosis not present, extra-ocular motions intact bilaterally V,VII: smile symmetric, facial light touch sensationintact VIII: hearing normal bilaterally IX,X: gag reflex present XI: bilateral shoulder shrug XII: midline tongue extension Motor: Right :Upper extremity 5/5Without pronator driftLeft: Upper extremity 5/5 without pronator drift Right:Lower extremity 5/5Left: Lower extremity 5/5 Tone and bulk:normal tone throughout; no atrophy noted Sensory: Pinprick and light touchintact bilaterally Deep Tendon Reflexes: 2+ and  symmetric throughout Plantars: Right:muteLeft: mute Cerebellar: Finger-to-nosetesting intact bilaterally.Heel to shin testing normal bilaterally Gait: not tested due to safety concerns  Data Reviewed  Lab Results: Basic Metabolic Panel: Recent Labs  Lab 09/23/18 1127  NA 137  K 4.9  CL 101  CO2 27  GLUCOSE 394*  BUN 23  CREATININE 1.06*  CALCIUM 9.5    Liver Function Tests: Recent Labs  Lab 09/23/18 1127  AST 18  ALT 15  ALKPHOS 60  BILITOT 0.7  PROT 6.9  ALBUMIN 3.8   No results for input(s): LIPASE, AMYLASE in the last 168 hours. No results for input(s): AMMONIA in the last 168 hours.  CBC: Recent Labs  Lab 09/23/18 1127  WBC 7.9  NEUTROABS 5.6  HGB 12.9  HCT 40.4  MCV 90.6  PLT 223    Cardiac Enzymes: Recent Labs  Lab 09/23/18 1127  TROPONINI 0.03*    Lipid Panel: Recent Labs  Lab 09/24/18 0449  CHOL 175  TRIG 163*  HDL 41  CHOLHDL 4.3  VLDL 33  LDLCALC 101*    CBG: Recent Labs  Lab 09/23/18 1704 09/23/18 2116 09/24/18 0754 09/24/18 1148  GLUCAP 252* 146* 202* 260*    Microbiology: No results found for this or any previous visit.  Coagulation Studies: Recent Labs    09/23/18 1127  LABPROT 12.6  INR 0.95    Imaging: Ct Angio Head W Or Wo Contrast  Result Date: 09/23/2018 CLINICAL DATA:  77 y/o F; peripheral vision loss of the left eye. Headache. Evaluate stroke for follow-up. EXAM: CT ANGIOGRAPHY HEAD AND NECK TECHNIQUE: Multidetector CT imaging of the head and neck was performed using the standard protocol during bolus  administration of intravenous contrast. Multiplanar CT image reconstructions and MIPs were obtained to evaluate the vascular anatomy. Carotid stenosis measurements (when applicable) are obtained utilizing NASCET criteria, using the distal internal carotid diameter as the denominator. CONTRAST:  75 cc Omnipaque 350 COMPARISON:  09/23/2018 carotid ultrasound. 09/23/2018 CT  head. 05/01/2011 CT chest. FINDINGS: CTA NECK FINDINGS Aortic arch: Standard branching. Imaged portion shows no evidence of aneurysm or dissection. No significant stenosis of the major arch vessel origins. Mild calcific atherosclerosis. Right carotid system: No evidence of dissection, stenosis (50% or greater) or occlusion. Left carotid system: No evidence of dissection, stenosis (50% or greater) or occlusion. Vertebral arteries: Codominant. No evidence of dissection, stenosis (50% or greater) or occlusion. Skeleton: Mild cervical spondylosis. C5-6 ACDF postsurgical changes. Other neck: 15 mm calcified nodule within the left lobe of the thyroid gland (series 4, image 23). Upper chest: Few stable 2-3 mm nodules in the lung apices compatible benign etiology. Review of the MIP images confirms the above findings CTA HEAD FINDINGS Anterior circulation: No significant stenosis, proximal occlusion, aneurysm, or vascular malformation. 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 Otherwise no large vessel occlusion, aneurysm, or significant stenosis is identified. Venous sinuses: As permitted by contrast timing, patent. Anatomic variants: None significant. Delayed phase: No abnormal intracranial enhancement. Right occipital lobe infarction is stable in distribution from the prior CT of the head. Review of the MIP images confirms the above findings IMPRESSION: CTA neck: 1. Patent carotid and vertebral arteries. No dissection, aneurysm, or hemodynamically significant stenosis utilizing NASCET criteria. 2. Mild calcific atherosclerosis of aortic arch. 3. 15 mm calcified nodule within left lobe of thyroid gland. Further evaluation with thyroid ultrasound is recommended on a nonemergent basis. CTA head: 1. 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 2. Otherwise no large vessel occlusion, aneurysm, or significant stenosis is  identified. Electronically Signed   By: Kristine Garbe M.D.   On: 09/23/2018 21:41   Ct Head Wo Contrast  Result Date: 09/23/2018 CLINICAL DATA:  Vision loss left eye.  Severe headache 3 days EXAM: CT HEAD WITHOUT CONTRAST TECHNIQUE: Contiguous axial images were obtained from the base of the skull through the vertex without intravenous contrast. COMPARISON:  CT head 04/12/2011 FINDINGS: Brain: Hypodensity right occipital lobe most compatible with acute/subacute infarct. No hemorrhage. Ventricle size normal. Mild chronic white matter changes. No midline shift or mass. Vascular: Negative for hyperdense vessel Skull: Negative Sinuses/Orbits: Negative Other: None IMPRESSION: Hypodensity right occipital lobe compatible with acute or subacute infarct. Negative for hemorrhage. These results were called by telephone at the time of interpretation on 09/23/2018 at 11:39 am to Dr. Lavonia Drafts , who verbally acknowledged these results. Electronically Signed   By: Franchot Gallo M.D.   On: 09/23/2018 11:40   Ct Angio Neck W Or Wo Contrast  Result Date: 09/23/2018 CLINICAL DATA:  77 y/o F; peripheral vision loss of the left eye. Headache. Evaluate stroke for follow-up. EXAM: CT ANGIOGRAPHY HEAD AND NECK TECHNIQUE: Multidetector CT imaging of the head and neck was performed using the standard protocol during bolus administration of intravenous contrast. Multiplanar CT image reconstructions and MIPs were obtained to evaluate the vascular anatomy. Carotid stenosis measurements (when applicable) are obtained utilizing NASCET criteria, using the distal internal carotid diameter as the denominator. CONTRAST:  75 cc Omnipaque 350 COMPARISON:  09/23/2018 carotid ultrasound. 09/23/2018 CT head. 05/01/2011 CT chest. FINDINGS: CTA NECK FINDINGS Aortic arch: Standard branching. Imaged portion shows  no evidence of aneurysm or dissection. No significant stenosis of the major arch vessel origins. Mild calcific  atherosclerosis. Right carotid system: No evidence of dissection, stenosis (50% or greater) or occlusion. Left carotid system: No evidence of dissection, stenosis (50% or greater) or occlusion. Vertebral arteries: Codominant. No evidence of dissection, stenosis (50% or greater) or occlusion. Skeleton: Mild cervical spondylosis. C5-6 ACDF postsurgical changes. Other neck: 15 mm calcified nodule within the left lobe of the thyroid gland (series 4, image 23). Upper chest: Few stable 2-3 mm nodules in the lung apices compatible benign etiology. Review of the MIP images confirms the above findings CTA HEAD FINDINGS Anterior circulation: No significant stenosis, proximal occlusion, aneurysm, or vascular malformation. 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 Otherwise no large vessel occlusion, aneurysm, or significant stenosis is identified. Venous sinuses: As permitted by contrast timing, patent. Anatomic variants: None significant. Delayed phase: No abnormal intracranial enhancement. Right occipital lobe infarction is stable in distribution from the prior CT of the head. Review of the MIP images confirms the above findings IMPRESSION: CTA neck: 1. Patent carotid and vertebral arteries. No dissection, aneurysm, or hemodynamically significant stenosis utilizing NASCET criteria. 2. Mild calcific atherosclerosis of aortic arch. 3. 15 mm calcified nodule within left lobe of thyroid gland. Further evaluation with thyroid ultrasound is recommended on a nonemergent basis. CTA head: 1. 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 2. Otherwise no large vessel occlusion, aneurysm, or significant stenosis is identified. Electronically Signed   By: Kristine Garbe M.D.   On: 09/23/2018 21:41   Mr Brain Wo Contrast  Result Date: 09/24/2018 CLINICAL DATA:  LEFT vision changes. Headache. History of diabetes and  hypertension. EXAM: MRI HEAD WITHOUT CONTRAST TECHNIQUE: Multiplanar, multiecho pulse sequences of the brain and surrounding structures were obtained without intravenous contrast. COMPARISON:  CT HEAD September 23, 2018 FINDINGS: INTRACRANIAL CONTENTS: Confluent RIGHT mesial temporal occipital and additional patchy RIGHT parietooccipital reduced diffusion with normalized ADC values, bright FLAIR T2 hyperintense signal and speckled susceptibility artifact compatible with petechial hemorrhage. Scattered subcentimeter supratentorial white matter FLAIR T2 hyperintensities. The ventricles and sulci are normal for patient's age. No suspicious parenchymal signal, masses, mass effect. No abnormal extra-axial fluid collections. No extra-axial masses. VASCULAR: Normal major intracranial vascular flow voids present at skull base. SKULL AND UPPER CERVICAL SPINE: No abnormal sellar expansion. No suspicious calvarial bone marrow signal. Craniocervical junction maintained. SINUSES/ORBITS: The mastoid air-cells and included paranasal sinuses are well-aerated.The included ocular globes and orbital contents are non-suspicious. Status post RIGHT ocular lens implants. OTHER: None. IMPRESSION: 1. 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 2. Mild chronic small vessel ischemic changes. Electronically Signed   By: Elon Alas M.D.   On: 09/24/2018 03:32   US Carotid Bilateral (at [facility] And [facility] Only)  Result Date: 09/23/2018 CLINICAL DATA:  Stroke EXAM: BILATERAL CAROTID DUPLEX ULTRASOUND TECHNIQUE: Pearline Cables scale imaging, color Doppler and duplex ultrasound were performed of bilateral carotid and vertebral arteries in the neck. COMPARISON:  None. FINDINGS: Criteria: Quantification of carotid stenosis is based on velocity parameters that correlate the residual internal carotid diameter with NASCET-based stenosis levels, using the diameter of the distal internal carotid lumen as the  denominator for stenosis measurement. The following velocity measurements were obtained: RIGHT ICA: 118 cm/sec CCA: 84 cm/sec SYSTOLIC ICA/CCA RATIO:  1.4 ECA: 75 cm/sec LEFT ICA: 91 cm/sec CCA: 74 cm/sec SYSTOLIC ICA/CCA RATIO:  1.2 ECA: 116  cm/sec RIGHT CAROTID ARTERY: Mild intimal thickening in the bulb. Low resistance internal carotid Doppler pattern. RIGHT VERTEBRAL ARTERY:  Antegrade. LEFT CAROTID ARTERY: Mild intimal thickening in the bulb with a tiny focal calcification. Low resistance internal carotid Doppler pattern. LEFT VERTEBRAL ARTERY:  Antegrade. IMPRESSION: Less than 50% stenosis in the right and left internal carotid arteries. Electronically Signed   By: Marybelle Killings M.D.   On: 09/23/2018 15:37   Medications:  I have reviewed the patient's current medications. Prior to Admission:  Medications Prior to Admission  Medication Sig Dispense Refill Last Dose  . cyanocobalamin (,VITAMIN B-12,) 1000 MCG/ML injection Inject 1,000 mcg into the muscle every 30 (thirty) days.   Past Month at Unknown time  . glipiZIDE (GLUCOTROL) 10 MG tablet Take 10 mg by mouth daily before breakfast.   09/22/2018 at 0800  . lisinopril (PRINIVIL,ZESTRIL) 5 MG tablet Take 5 mg by mouth daily.   09/22/2018 at 0800  . meloxicam (MOBIC) 15 MG tablet Take 15 mg by mouth daily.   09/22/2018 at 0800  . metFORMIN (GLUCOPHAGE) 500 MG tablet Take by mouth 2 (two) times daily with a meal.   09/22/2018 at 2100  . pravastatin (PRAVACHOL) 80 MG tablet Take 80 mg by mouth daily.   09/22/2018 at 0800  . sertraline (ZOLOFT) 50 MG tablet Take 50 mg by mouth daily.   09/22/2018 at 0800  . SitaGLIPtin-MetFORMIN HCl 50-500 MG TB24 Take by mouth.   09/22/2018 at 0800  . amoxicillin-clavulanate (AUGMENTIN) 875-125 MG tablet Take 1 tablet by mouth 2 (two) times daily. (Patient not taking: Reported on 09/23/2018) 20 tablet 0 Not Taking at Unknown time  . ibuprofen (ADVIL) 200 MG tablet Take 2 tablets (400 mg total) by mouth every 8 (eight)  hours as needed for moderate pain. (Patient not taking: Reported on 09/23/2018) 30 tablet 0 Not Taking at Unknown time  . traMADol (ULTRAM) 50 MG tablet Take 1 tablet (50 mg total) by mouth every 12 (twelve) hours as needed for moderate pain. (Patient not taking: Reported on 09/23/2018) 12 tablet 0 Not Taking at Unknown time   Scheduled: . aspirin  81 mg Oral Daily  . enoxaparin (LOVENOX) injection  40 mg Subcutaneous Q12H  . insulin aspart  0-15 Units Subcutaneous TID WC  . insulin aspart  0-5 Units Subcutaneous QHS  . insulin glargine  10 Units Subcutaneous QHS  . pravastatin  80 mg Oral Daily  . sertraline  50 mg Oral Daily   Assessment: 77 y.o. female with past medical history of diabetes mellitus without complications, hyperlipidemia, hypertension, and vitamin B12 deficiency presenting to the ED 09/23/2018 with complaints left vision field deficit. CT head showed hypodensity in the right occipital lobe compatible with acute or subacute infarct.  MRI of the brain shows subacute right PCA and posterior watershed territory infarcts with petechial hemorrhage. CTA head and neck showed calcium embolus within the right posterior cerebral artery branch otherwise no large vessel occlusion, aneurysm, or significant stenosis.  Hemoglobin A1c 9.7, LDL 101.  Plan: 1. Start Aspirin 81 mg/day with intensive management of vascular risk factor to keep systolic BP (SBP) <119 mm Hg (130 mm Hg if diabetic). 2. Start statin with goal low density lipoprotein (LDL) <70 mg/dl 3. Glycemic control with goal  <7.0%  4. Echocardiogram pending 5. Recommend follow up with outpatient neurology  This patient was staffed with Dr. Babette Relic, [facility] who personally evaluated patient, reviewed documentation and agreed with assessment and plan of care as above.  Benjamine Mola  Cammie Sickle, FNP-BC Board certified Nurse Practitioner Neurology Department    LOS: 1 day   09/24/2018  1:38 PM

## 2018-09-24 NOTE — Progress Notes (Signed)
*  PRELIMINARY RESULTS* Echocardiogram 2D Echocardiogram has been performed.  April [name] 09/24/2018, 9:52 AM

## 2018-09-24 NOTE — Progress Notes (Signed)
Inpatient Diabetes Program Recommendations  AACE/ADA: New Consensus Statement on Inpatient Glycemic Control (2019)  Target Ranges:  Prepandial:   less than 140 mg/dL      Peak postprandial:   less than 180 mg/dL (1-2 hours)      Critically ill patients:  140 - 180 mg/dL   Results for April, [name] (MRN [id number]) as of 09/24/2018 15:18  Ref. Range 09/23/2018 17:04 09/23/2018 21:16 09/24/2018 07:54 09/24/2018 11:48  Glucose-Capillary Latest Ref Range: 70 - 99 mg/dL 252 (H) 146 (H) 202 (H) 260 (H)  Results for April, [name] (MRN [id number]) as of 09/24/2018 15:18  Ref. Range 09/24/2018 04:49  Hemoglobin A1C Latest Ref Range: 4.8 - 5.6 % 9.7 (H)   Review of Glycemic Control  Diabetes history: DM2 Outpatient Diabetes medications: Metformin 500 mg BID, Glipizide 10 mg BID Current orders for Inpatient glycemic control: Lantus 10 units QHS, Novolog 0-15 units TID with meals, Novolog 0-5 units QHS  Inpatient Diabetes Program Recommendations:  Insulin - Basal: Please consider increasing Lantus to 15 units QHS. HgbA1C: Current A1C 9.7% on 09/24/18. Per Care Everywhere, A1C 11.4% on 08/14/18 at last office visit with Dr. Kary Kos.  Outpatient DM medications: Would recommend increasing Metformin to 1000 mg BID as an outpatient. Patient has taken Victoza in the past and tolerated lowest dose. Encouraged patient to talk with PCP about restarting on lowest dose of Victoza.   NOTE: Spoke with patient about diabetes and home regimen for diabetes control. Patient reports that she is followed by PCP for diabetes management and she seen PCP in October 2019. Patient confirms that prior to the October visit, it had been over a year since she seen PCP. Patient states that her PCP was still allowing her to refill prescriptions and she has DM medications at home. Patient reports that she is taking Glipizide 10 mg BID and Metformin 500 mg BID as an outpatient for diabetes control. Noted Janumet on home medication list and  patient states that she is NOT taking Janumet due to cost.  Patient reports that she does not check her glucose very often but she has everything at home to check her glucose.  Discussed A1C results (9.7% on 09/24/18) and explained that current A1C indicates an average glucose of 232 mg/dl over the past 2-3 months. Patient stated that current A1C was an improvement from last A1C at PCP of 11.4%.  Discussed glucose and A1C goals. Discussed importance of checking CBGs and maintaining good CBG control to prevent long-term and short-term complications. Explained how hyperglycemia leads to damage within blood vessels which lead to the common complications seen with uncontrolled diabetes. Stressed to the patient the importance of improving glycemic control to prevent further complications from uncontrolled diabetes. Explained to patient that she likely needs additional DM medications for outpatient control. Patient states that she use to take Victoza but it was stopped because she could not tolerate higher dose of the medications (caused nausea, vomiting). Patient states that she tolerated the minimum dose of the Victoza without any issues at all and she felt her glucose was better when she was on the Victoza.  Encouraged patient to ask PCP about restarting her on Victoza at lowest dose.   Encouraged patient to check her glucose 3- 4 times per day (before meals and at bedtime) and to keep a log book of glucose readings.  Patient verbalized understanding of information discussed and she states that she has no further questions at this time related to diabetes.  Thanks,  Barnie Alderman, RN, MSN, CDE Diabetes Coordinator Inpatient Diabetes Program [phone] (Team Pager)

## 2018-09-25 ENCOUNTER — Inpatient Hospital Stay (HOSPITAL_COMMUNITY)
Admit: 2018-09-25 | Discharge: 2018-09-25 | Disposition: A | Discharge status: Home / Self Care | Payer: Medicare HMO | Attending: Cardiovascular Disease | Admitting: Cardiovascular Disease

## 2018-09-25 DIAGNOSIS — R931 Abnormal findings on diagnostic imaging of heart and coronary circulation: Secondary | ICD-10-CM

## 2018-09-25 DIAGNOSIS — I639 Cerebral infarction, unspecified: Secondary | ICD-10-CM

## 2018-09-25 DIAGNOSIS — H5340 Unspecified visual field defects: Secondary | ICD-10-CM

## 2018-09-25 DIAGNOSIS — E782 Mixed hyperlipidemia: Secondary | ICD-10-CM

## 2018-09-25 DIAGNOSIS — I447 Left bundle-branch block, unspecified: Secondary | ICD-10-CM

## 2018-09-25 DIAGNOSIS — E118 Type 2 diabetes mellitus with unspecified complications: Secondary | ICD-10-CM

## 2018-09-25 LAB — GLUCOSE, CAPILLARY
GLUCOSE-CAPILLARY: 190 mg/dL — AB (ref 70–99)
GLUCOSE-CAPILLARY: 168 mg/dL — AB (ref 70–99)

## 2018-09-25 LAB — ECHOCARDIOGRAM LIMITED
Height: 62 in
Weight: 4048 oz

## 2018-09-25 MED ORDER — INSULIN GLARGINE 100 UNIT/ML ~~LOC~~ SOLN
15.0000 [IU] | Freq: Every day | SUBCUTANEOUS | Status: DC
  Filled 2018-09-25: qty 0.15

## 2018-09-25 MED ORDER — EZETIMIBE 10 MG PO TABS: 10.0000 mg | ORAL_TABLET | Freq: Every day | ORAL | 2 refills | Status: DC

## 2018-09-25 MED ORDER — ASPIRIN 81 MG PO CHEW: 81.0000 mg | CHEWABLE_TABLET | Freq: Every day | ORAL | 2 refills | Status: DC

## 2018-09-25 MED ORDER — PERFLUTREN LIPID MICROSPHERE
1.0000 mL | INTRAVENOUS | Status: AC | PRN
  Administered 2018-09-25: 2 mL via INTRAVENOUS
  Filled 2018-09-25: qty 10

## 2018-09-25 MED ORDER — METFORMIN HCL 500 MG PO TABS: 1000.0000 mg | ORAL_TABLET | Freq: Two times a day (BID) | ORAL | 1 refills | Status: AC

## 2018-09-25 NOTE — Plan of Care (Signed)
Problem: Education: Goal: Knowledge of General Education information will improve Description Including pain rating scale, medication(s)/side effects and non-pharmacologic comfort measures 09/25/2018 1210 by Rowe Robert, RN Outcome: Progressing 09/25/2018 1207 by Rowe Robert, RN Outcome: Progressing   Problem: Health Behavior/Discharge Planning: Goal: Ability to manage health-related needs will improve 09/25/2018 1210 by Rowe Robert, RN Outcome: Progressing 09/25/2018 1207 by Rowe Robert, RN Outcome: Progressing   Problem: Clinical Measurements: Goal: Ability to maintain clinical measurements within normal limits will improve 09/25/2018 1210 by Rowe Robert, RN Outcome: Progressing 09/25/2018 1207 by Rowe Robert, RN Outcome: Progressing Goal: Will remain free from infection 09/25/2018 1210 by Rowe Robert, RN Outcome: Progressing 09/25/2018 1207 by Rowe Robert, RN Outcome: Progressing Goal: Diagnostic test results will improve 09/25/2018 1210 by Rowe Robert, RN Outcome: Progressing 09/25/2018 1207 by Rowe Robert, RN Outcome: Progressing Goal: Respiratory complications will improve 09/25/2018 1210 by Rowe Robert, RN Outcome: Progressing 09/25/2018 1207 by Rowe Robert, RN Outcome: Progressing Goal: Cardiovascular complication will be avoided 09/25/2018 1210 by Rowe Robert, RN Outcome: Progressing 09/25/2018 1207 by Rowe Robert, RN Outcome: Progressing   Problem: Activity: Goal: Risk for activity intolerance will decrease 09/25/2018 1210 by Rowe Robert, RN Outcome: Progressing 09/25/2018 1207 by Rowe Robert, RN Outcome: Progressing   Problem: Nutrition: Goal: Adequate nutrition will be maintained 09/25/2018 1210 by Rowe Robert, RN Outcome: Progressing 09/25/2018 1207 by Rowe Robert, RN Outcome: Progressing   Problem: Coping: Goal: Level of anxiety will decrease 09/25/2018 1210 by Rowe Robert, RN Outcome:  Progressing 09/25/2018 1207 by Rowe Robert, RN Outcome: Progressing   Problem: Elimination: Goal: Will not experience complications related to bowel motility 09/25/2018 1210 by Rowe Robert, RN Outcome: Progressing 09/25/2018 1207 by Rowe Robert, RN Outcome: Progressing Goal: Will not experience complications related to urinary retention 09/25/2018 1210 by Rowe Robert, RN Outcome: Progressing 09/25/2018 1207 by Rowe Robert, RN Outcome: Progressing   Problem: Pain Managment: Goal: General experience of comfort will improve 09/25/2018 1210 by Rowe Robert, RN Outcome: Progressing 09/25/2018 1207 by Rowe Robert, RN Outcome: Progressing   Problem: Safety: Goal: Ability to remain free from injury will improve 09/25/2018 1210 by Rowe Robert, RN Outcome: Progressing 09/25/2018 1207 by Rowe Robert, RN Outcome: Progressing   Problem: Skin Integrity: Goal: Risk for impaired skin integrity will decrease 09/25/2018 1210 by Rowe Robert, RN Outcome: Progressing 09/25/2018 1207 by Rowe Robert, RN Outcome: Progressing   Problem: Education: Goal: Knowledge of secondary prevention will improve 09/25/2018 1210 by Rowe Robert, RN Outcome: Progressing 09/25/2018 1207 by Rowe Robert, RN Outcome: Progressing Goal: Knowledge of patient specific risk factors addressed and post discharge goals established will improve 09/25/2018 1210 by Rowe Robert, RN Outcome: Progressing 09/25/2018 1207 by Rowe Robert, RN Outcome: Progressing   Problem: Coping: Goal: Will verbalize positive feelings about self 09/25/2018 1210 by Rowe Robert, RN Outcome: Progressing 09/25/2018 1207 by Rowe Robert, RN Outcome: Progressing   Problem: Health Behavior/Discharge Planning: Goal: Ability to manage health-related needs will improve 09/25/2018 1210 by Rowe Robert, RN Outcome: Progressing 09/25/2018 1207 by Rowe Robert, RN Outcome: Progressing   Problem:  Self-Care: Goal: Ability to participate in self-care as condition permits will improve 09/25/2018 1210 by Rowe Robert, RN Outcome: Progressing 09/25/2018 1207 by Rowe Robert, RN Outcome: Progressing   Problem: Nutrition: Goal: Risk of aspiration will decrease 09/25/2018 1210 by Rowe Robert, RN Outcome: Progressing 09/25/2018 1207 by Rowe Robert, RN Outcome: Progressing   Problem: Ischemic Stroke/TIA Tissue Perfusion: Goal: Complications of ischemic stroke/TIA will be minimized 09/25/2018 1210 by Rowe Robert, RN  Outcome: Progressing 09/25/2018 1207 by Rowe Robert, RN Outcome: Progressing

## 2018-09-25 NOTE — Care Management Note (Signed)
Case Management Note  Patient Details  Name: April [name] MRN: [id number] Date of Birth: [birth year]  Subjective/Objective:   Patient has orders for DME rolling walker. Will obtain from [facility]                 Action/Plan:   Expected Discharge Date:  09/25/18               Expected Discharge Plan:     In-House Referral:     Discharge planning Services  CM Consult  Post Acute Care Choice:  Durable Medical Equipment Choice offered to:  Patient  DME Arranged:  [facility] Agency:  [place] Arranged:    [facility] Agency:     Status of Service:  Completed, signed off  If discussed at [place] of Stay Meetings, dates discussed:    Additional Comments:  Latanya Maudlin, RN 09/25/2018, 12:09 PM

## 2018-09-25 NOTE — Plan of Care (Signed)
Dr Rockey Situ and pa-c saw pt. Echo repeated see results.  Pt cleared by them to be discharged home. A/o.  No distress or c/o.  Instructions discussed with pt. Meds diet / activity and f/u. Verbalizes understanding.

## 2018-09-25 NOTE — Progress Notes (Signed)
Physical Therapy Treatment Patient Details Name: NEWELL FRATER MRN: [id number] DOB: [birth year] Today's Date: 09/25/2018    History of Present Illness Pt is a 77 y.o. female presenting to hospital 09/23/18 with L sided vision loss Monday morning.  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  PMH includes DM, htn.    PT Comments    Bed mobility without difficulty.  Reports walking to/from bathroom without assist.  She was able to ambulate 200' without AD with lateral sway often reaching for handrails for balance.  Trial of RW 40' with improved gait and no lateral sway.  Pt does report she feels more comfortable.  While she may not need RW in her home for mobility, she will benefit from a RW in the community or outside for general safety.  Pt agrees and would like a walker upon discharge.   Follow Up Recommendations  No PT follow up     Equipment Recommendations  Rolling walker with 5" wheels    Recommendations for Other Services       Precautions / Restrictions Precautions Precautions: Fall Restrictions Weight Bearing Restrictions: No    Mobility  Bed Mobility Overal bed mobility: Modified Independent                Transfers Overall transfer level: Modified independent   Transfers: Sit to/from Stand Sit to Stand: Modified independent (Device/Increase time)            Ambulation/Gait Ambulation/Gait assistance: Supervision;Min guard Gait Distance (Feet): 250 Feet Assistive device: None;Rolling walker (2 wheeled)   Gait velocity: decreased   General Gait Details: 200' without AD with lateral sway.  66' with RW with corrected lateral sway   Stairs             Wheelchair Mobility    Modified Rankin (Stroke Patients Only)       Balance Overall balance assessment: Needs assistance Sitting-balance support: No upper extremity supported;Feet supported Sitting balance-Leahy Scale: Normal      Standing balance support: No upper extremity supported;During functional activity Standing balance-Leahy Scale: Good                              Cognition Arousal/Alertness: Awake/alert Behavior During Therapy: WFL for tasks assessed/performed Overall Cognitive Status: Within Functional Limits for tasks assessed                                        Exercises      General Comments        Pertinent Vitals/Pain Pain Assessment: No/denies pain    Home Living                      Prior Function            PT Goals (current goals can now be found in the care plan section) Progress towards PT goals: Progressing toward goals    Frequency    7X/week      PT Plan Current plan remains appropriate    Co-evaluation              AM-PAC PT "6 Clicks" Mobility   Outcome Measure  Help needed turning from your back to your side while in a flat bed without using bedrails?: None Help needed moving from lying on your back  to sitting on the side of a flat bed without using bedrails?: None Help needed moving to and from a bed to a chair (including a wheelchair)?: None Help needed standing up from a chair using your arms (e.g., wheelchair or bedside chair)?: None Help needed to walk in hospital room?: A Little Help needed climbing 3-5 steps with a railing? : A Little 6 Click Score: 22    End of Session Equipment Utilized During Treatment: Gait belt Activity Tolerance: Patient tolerated treatment well Patient left: in bed;with call bell/phone within reach         Time: 9558-3167 PT Time Calculation (min) (ACUTE ONLY): 9 min  Charges:  $Gait Training: 8-22 mins                     Chesley Noon, PTA 09/25/18, 8:49 AM

## 2018-09-25 NOTE — Progress Notes (Signed)
*  PRELIMINARY RESULTS* Echocardiogram 2D Echocardiogram has been performed.  Sherrie Sport 09/25/2018, 11:36 AM

## 2018-09-25 NOTE — Consult Note (Signed)
Cardiology Consultation:   Patient ID: April [name] MRN: [id number]; DOB: Mar 13, 1941  Admit date: 09/23/2018 Date of Consult: 09/25/2018  Primary Care Provider: [place] Pink, MD Primary Cardiologist: new to [facility] Reason for consult: Stroke, cardiomyopathy Physician requesting consult: Dr Benjie Karvonen   Patient Profile:   April [name] is a 77 y.o. female with a hx of poorly controlled diabetes, hyperlipidemia, hypertension, obesity presenting with headache, vision loss, found to have stroke   History of Present Illness:   Woke up December 2 with left eye vision loss in the periphery,  did not realized that she had lost peripheral vision on the left until she started bumping into objects. painless loss of vision in the left eye She does have some headache posterior aspect of her head see primary care physician who sent her to the emergency room for further evaluation  CT scan performed emergency room showed hypodensity right occipital lobe compatible with acute or subacute infarct. Negative for hemorrhage.  Outside window for TPA  Seen by neurology, echocardiogram was ordered Denies any tachycardia or palpitations concerning for arrhythmia  Telemetry showing no significant arrhythmia Patent carotid arteries and vertebral arteries with no significant stenosis Mild calcification in the aorta There was a calcium embolus in the right posterior cerebral artery calcarine branch    Past Medical History:  Diagnosis Date  . B12 deficiency   . Cataracts, both eyes   . Diabetes mellitus without complication ([place])   . Dyslipidemia   . Hypertension     Past Surgical History:  Procedure Laterality Date  . CATARACT EXTRACTION    . COLONOSCOPY    . COLONOSCOPY WITH PROPOFOL N/A 07/04/2015   Procedure: COLONOSCOPY WITH PROPOFOL;  Surgeon: Josefine Class, MD;  Location: [facility] ENDOSCOPY;  Service: Endoscopy;  Laterality: N/A;  . EYE SURGERY       Home Medications:  Prior to  Admission medications   Medication Sig Start Date End Date Taking? Authorizing Provider  cyanocobalamin (,VITAMIN B-12,) 1000 MCG/ML injection Inject 1,000 mcg into the muscle every 30 (thirty) days. 02/18/14  Yes [provider]  glipiZIDE (GLUCOTROL) 10 MG tablet Take 10 mg by mouth daily before breakfast.   Yes [provider]  lisinopril (PRINIVIL,ZESTRIL) 5 MG tablet Take 5 mg by mouth daily.   Yes [provider]  meloxicam (MOBIC) 15 MG tablet Take 15 mg by mouth daily.   Yes [provider]  pravastatin (PRAVACHOL) 80 MG tablet Take 80 mg by mouth daily.   Yes [provider]  sertraline (ZOLOFT) 50 MG tablet Take 50 mg by mouth daily.   Yes [provider]  aspirin 81 MG chewable tablet Chew 1 tablet (81 mg total) by mouth daily. 09/26/18   Demetrios Loll, MD  ezetimibe (ZETIA) 10 MG tablet Take 1 tablet (10 mg total) by mouth daily. 09/25/18 09/25/19  Demetrios Loll, MD  metFORMIN (GLUCOPHAGE) 500 MG tablet Take 2 tablets (1,000 mg total) by mouth 2 (two) times daily with a meal. 09/25/18   Demetrios Loll, MD    Inpatient Medications: Scheduled Meds: . aspirin  81 mg Oral Daily  . carvedilol  3.125 mg Oral BID WC  . enoxaparin (LOVENOX) injection  40 mg Subcutaneous Q12H  . insulin aspart  0-15 Units Subcutaneous TID WC  . insulin aspart  0-5 Units Subcutaneous QHS  . insulin glargine  15 Units Subcutaneous QHS  . lisinopril  5 mg Oral Daily  . pravastatin  80 mg Oral Daily  . sertraline  50 mg Oral Daily   Continuous Infusions:  PRN Meds: acetaminophen **OR** acetaminophen (TYLENOL) oral liquid 160 mg/5 mL **OR** acetaminophen, hydrALAZINE, traMADol  Allergies:    Allergies  Allergen Reactions  . Crestor [Rosuvastatin Calcium]   . Lipitor [Atorvastatin]   . Liraglutide   . Sulfa Antibiotics   . Voltaren [Diclofenac Sodium] Nausea Only  . Zocor [Simvastatin] Other (See Comments)    Muscle pain    Social History:   Social  History   Socioeconomic History  . Marital status: Single    Spouse name: Not on file  . Number of children: Not on file  . Years of education: Not on file  . Highest education level: Not on file  Occupational History  . Not on file  Social Needs  . Financial resource strain: Not on file  . Food insecurity:    Worry: Not on file    Inability: Not on file  . Transportation needs:    Medical: Not on file    Non-medical: Not on file  Tobacco Use  . Smoking status: Former Research scientist (life sciences)  . Smokeless tobacco: Never Used  Substance and Sexual Activity  . Alcohol use: No  . Drug use: Never  . Sexual activity: Not on file  Lifestyle  . Physical activity:    Days per week: Not on file    Minutes per session: Not on file  . Stress: Not on file  Relationships  . Social connections:    Talks on phone: Not on file    Gets together: Not on file    Attends religious service: Not on file    Active member of club or organization: Not on file    Attends meetings of clubs or organizations: Not on file    Relationship status: Not on file  . Intimate partner violence:    Fear of current or ex partner: Not on file    Emotionally abused: Not on file    Physically abused: Not on file    Forced sexual activity: Not on file  Other Topics Concern  . Not on file  Social History Narrative  . Not on file    Family History:    Family History  Problem Relation Age of Onset  . Breast cancer Sister 28     ROS:  Please see the history of present illness.  Review of Systems  Constitutional: Negative.   Respiratory: Negative.   Cardiovascular: Negative.   Gastrointestinal: Negative.   Musculoskeletal: Negative.   Neurological: Negative.        Vision loss left eye  Psychiatric/Behavioral: Negative.   All other systems reviewed and are negative.    Physical Exam/Data:   Vitals:   09/24/18 1926 09/25/18 0101 09/25/18 0451 09/25/18 1155  BP: (!) 128/52 136/63 (!) 143/70 122/61  Pulse: 83  81 71 72  Resp: 18 16 18    Temp: 98.1 F (36.7 C) 98.1 F (36.7 C) 98 F (36.7 C) 98.3 F (36.8 C)  TempSrc: Oral Oral Oral Oral  SpO2: 95% 95% 94% 98%  Weight:      Height:       No intake or output data in the 24 hours ending 09/25/18 1929 Filed Weights   09/23/18 1114  Weight: 114.8 kg   Body mass index is 46.27 kg/m.  General:  Well nourished, well developed, in no acute distress HEENT: normal Lymph: no adenopathy Neck: no JVD Endocrine:  No thryomegaly Vascular: No carotid bruits; FA pulses 2+ bilaterally without bruits  Cardiac:  normal S1, S2; RRR; no murmur  Lungs:  clear to auscultation bilaterally, no wheezing, rhonchi or rales  Abd: soft, nontender, no hepatomegaly  Ext: no edema Musculoskeletal:  No deformities, BUE and BLE strength normal and equal Skin: warm and dry  Neuro:  CNs 2-12 intact, no focal abnormalities noted Left eye vision loss in the periphery Psych:  Normal affect   EKG:  The EKG was personally reviewed and demonstrates: Normal sinus rhythm with left bundle branch block Bundle branch block is new since 2009 No EKGs available from primary care, records reviewed  Telemetry:  Telemetry was personally reviewed and demonstrates:  Normal sinus rhythm  Relevant CV Studies: Initial transthoracic echocardiogram with ejection fraction estimated at 20 to 25% Read by outside physician Images were reviewed personally by myself, it was felt ejection fraction was underestimated secondary to challenging images  Repeat echocardiogram ordered by myself with Def[facility] showing much improved ejection fraction 50 to 55% Slight decrease in ejection fraction secondary to bundle branch block  Laboratory Data:  Chemistry Recent Labs  Lab 09/23/18 1127  NA 137  K 4.9  CL 101  CO2 27  GLUCOSE 394*  BUN 23  CREATININE 1.06*  CALCIUM 9.5  GFRNONAA 51*  GFRAA 59*  ANIONGAP 9    Recent Labs  Lab 09/23/18 1127  PROT 6.9  ALBUMIN 3.8  AST 18  ALT 15   ALKPHOS 60  BILITOT 0.7   Hematology Recent Labs  Lab 09/23/18 1127  WBC 7.9  RBC 4.46  HGB 12.9  HCT 40.4  MCV 90.6  MCH 28.9  MCHC 31.9  RDW 12.4  PLT 223   Cardiac Enzymes Recent Labs  Lab 09/23/18 1127  TROPONINI 0.03*   No results for input(s): TROPIPOC in the last 168 hours.  BNPNo results for input(s): BNP, PROBNP in the last 168 hours.  DDimer No results for input(s): DDIMER in the last 168 hours.  Radiology/Studies:  Ct Angio Head W Or Wo Contrast  Result Date: 09/23/2018 CLINICAL DATA:  77 y/o F; peripheral vision loss of the left eye. Headache. Evaluate stroke for follow-up. EXAM: CT ANGIOGRAPHY HEAD AND NECK TECHNIQUE: Multidetector CT imaging of the head and neck was performed using the standard protocol during bolus administration of intravenous contrast. Multiplanar CT image reconstructions and MIPs were obtained to evaluate the vascular anatomy. Carotid stenosis measurements (when applicable) are obtained utilizing NASCET criteria, using the distal internal carotid diameter as the denominator. CONTRAST:  75 cc Omnipaque 350 COMPARISON:  09/23/2018 carotid ultrasound. 09/23/2018 CT head. 05/01/2011 CT chest. FINDINGS: CTA NECK FINDINGS Aortic arch: Standard branching. Imaged portion shows no evidence of aneurysm or dissection. No significant stenosis of the major arch vessel origins. Mild calcific atherosclerosis. Right carotid system: No evidence of dissection, stenosis (50% or greater) or occlusion. Left carotid system: No evidence of dissection, stenosis (50% or greater) or occlusion. Vertebral arteries: Codominant. No evidence of dissection, stenosis (50% or greater) or occlusion. Skeleton: Mild cervical spondylosis. C5-6 ACDF postsurgical changes. Other neck: 15 mm calcified nodule within the left lobe of the thyroid gland (series 4, image 23). Upper chest: Few stable 2-3 mm nodules in the lung apices compatible benign etiology. Review of the MIP images confirms the  above findings CTA HEAD FINDINGS Anterior circulation: No significant stenosis, proximal occlusion, aneurysm, or vascular malformation. 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 Otherwise no large vessel occlusion, aneurysm, or significant stenosis is identified. Venous  sinuses: As permitted by contrast timing, patent. Anatomic variants: None significant. Delayed phase: No abnormal intracranial enhancement. Right occipital lobe infarction is stable in distribution from the prior CT of the head. Review of the MIP images confirms the above findings IMPRESSION: CTA neck: 1. Patent carotid and vertebral arteries. No dissection, aneurysm, or hemodynamically significant stenosis utilizing NASCET criteria. 2. Mild calcific atherosclerosis of aortic arch. 3. 15 mm calcified nodule within left lobe of thyroid gland. Further evaluation with thyroid ultrasound is recommended on a nonemergent basis. CTA head: 1. 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 2. Otherwise no large vessel occlusion, aneurysm, or significant stenosis is identified. Electronically Signed   By: Kristine Garbe M.D.   On: 09/23/2018 21:41   Ct Head Wo Contrast  Result Date: 09/23/2018 CLINICAL DATA:  Vision loss left eye.  Severe headache 3 days EXAM: CT HEAD WITHOUT CONTRAST TECHNIQUE: Contiguous axial images were obtained from the base of the skull through the vertex without intravenous contrast. COMPARISON:  CT head 04/12/2011 FINDINGS: Brain: Hypodensity right occipital lobe most compatible with acute/subacute infarct. No hemorrhage. Ventricle size normal. Mild chronic white matter changes. No midline shift or mass. Vascular: Negative for hyperdense vessel Skull: Negative Sinuses/Orbits: Negative Other: None IMPRESSION: Hypodensity right occipital lobe compatible with acute or subacute infarct. Negative for hemorrhage. These  results were called by telephone at the time of interpretation on 09/23/2018 at 11:39 am to Dr. Lavonia Drafts , who verbally acknowledged these results. Electronically Signed   By: Franchot Gallo M.D.   On: 09/23/2018 11:40   Ct Angio Neck W Or Wo Contrast  Result Date: 09/23/2018 CLINICAL DATA:  77 y/o F; peripheral vision loss of the left eye. Headache. Evaluate stroke for follow-up. EXAM: CT ANGIOGRAPHY HEAD AND NECK TECHNIQUE: Multidetector CT imaging of the head and neck was performed using the standard protocol during bolus administration of intravenous contrast. Multiplanar CT image reconstructions and MIPs were obtained to evaluate the vascular anatomy. Carotid stenosis measurements (when applicable) are obtained utilizing NASCET criteria, using the distal internal carotid diameter as the denominator. CONTRAST:  75 cc Omnipaque 350 COMPARISON:  09/23/2018 carotid ultrasound. 09/23/2018 CT head. 05/01/2011 CT chest. FINDINGS: CTA NECK FINDINGS Aortic arch: Standard branching. Imaged portion shows no evidence of aneurysm or dissection. No significant stenosis of the major arch vessel origins. Mild calcific atherosclerosis. Right carotid system: No evidence of dissection, stenosis (50% or greater) or occlusion. Left carotid system: No evidence of dissection, stenosis (50% or greater) or occlusion. Vertebral arteries: Codominant. No evidence of dissection, stenosis (50% or greater) or occlusion. Skeleton: Mild cervical spondylosis. C5-6 ACDF postsurgical changes. Other neck: 15 mm calcified nodule within the left lobe of the thyroid gland (series 4, image 23). Upper chest: Few stable 2-3 mm nodules in the lung apices compatible benign etiology. Review of the MIP images confirms the above findings CTA HEAD FINDINGS Anterior circulation: No significant stenosis, proximal occlusion, aneurysm, or vascular malformation. Posterior circulation: Calcium embolus within the right posterior cerebral artery calcarine  branch (series 7, image 121). Otherwise no large vessel occlusion, aneurysm, or significant stenosis is identified. Venous sinuses: As permitted by contrast timing, patent. Anatomic variants: None significant. Delayed phase: No abnormal intracranial enhancement. Right occipital lobe infarction is stable in distribution from the prior CT of the head. Review of the MIP images confirms the above findings IMPRESSION: CTA neck: 1. Patent carotid and vertebral arteries. No dissection, aneurysm, or hemodynamically significant stenosis utilizing NASCET criteria. 2. Mild calcific atherosclerosis  of aortic arch. 3. 15 mm calcified nodule within left lobe of thyroid gland. Further evaluation with thyroid ultrasound is recommended on a nonemergent basis. CTA head: 1. 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 2. Otherwise no large vessel occlusion, aneurysm, or significant stenosis is identified. Electronically Signed   By: Kristine Garbe M.D.   On: 09/23/2018 21:41   Mr Brain Wo Contrast  Result Date: 09/24/2018 CLINICAL DATA:  LEFT vision changes. Headache. History of diabetes and hypertension. EXAM: MRI HEAD WITHOUT CONTRAST TECHNIQUE: Multiplanar, multiecho pulse sequences of the brain and surrounding structures were obtained without intravenous contrast. COMPARISON:  CT HEAD September 23, 2018 FINDINGS: INTRACRANIAL CONTENTS: Confluent RIGHT mesial temporal occipital and additional patchy RIGHT parietooccipital reduced diffusion with normalized ADC values, bright FLAIR T2 hyperintense signal and speckled susceptibility artifact compatible with petechial hemorrhage. Scattered subcentimeter supratentorial white matter FLAIR T2 hyperintensities. The ventricles and sulci are normal for patient's age. No suspicious parenchymal signal, masses, mass effect. No abnormal extra-axial fluid collections. No extra-axial masses. VASCULAR: Normal major intracranial vascular flow voids present at  skull base. SKULL AND UPPER CERVICAL SPINE: No abnormal sellar expansion. No suspicious calvarial bone marrow signal. Craniocervical junction maintained. SINUSES/ORBITS: The mastoid air-cells and included paranasal sinuses are well-aerated.The included ocular globes and orbital contents are non-suspicious. Status post RIGHT ocular lens implants. OTHER: None. IMPRESSION: 1. 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 2. Mild chronic small vessel ischemic changes. Electronically Signed   By: Elon Alas M.D.   On: 09/24/2018 03:32   US Carotid Bilateral (at [facility] And [facility] Only)  Result Date: 09/23/2018 CLINICAL DATA:  Stroke EXAM: BILATERAL CAROTID DUPLEX ULTRASOUND TECHNIQUE: Pearline Cables scale imaging, color Doppler and duplex ultrasound were performed of bilateral carotid and vertebral arteries in the neck. COMPARISON:  None. FINDINGS: Criteria: Quantification of carotid stenosis is based on velocity parameters that correlate the residual internal carotid diameter with NASCET-based stenosis levels, using the diameter of the distal internal carotid lumen as the denominator for stenosis measurement. The following velocity measurements were obtained: RIGHT ICA: 118 cm/sec CCA: 84 cm/sec SYSTOLIC ICA/CCA RATIO:  1.4 ECA: 75 cm/sec LEFT ICA: 91 cm/sec CCA: 74 cm/sec SYSTOLIC ICA/CCA RATIO:  1.2 ECA: 116 cm/sec RIGHT CAROTID ARTERY: Mild intimal thickening in the bulb. Low resistance internal carotid Doppler pattern. RIGHT VERTEBRAL ARTERY:  Antegrade. LEFT CAROTID ARTERY: Mild intimal thickening in the bulb with a tiny focal calcification. Low resistance internal carotid Doppler pattern. LEFT VERTEBRAL ARTERY:  Antegrade. IMPRESSION: Less than 50% stenosis in the right and left internal carotid arteries. Electronically Signed   By: Marybelle Killings M.D.   On: 09/23/2018 15:37    Assessment and Plan:   1) CVA CT scan showing calcific emboli, Vascular disease Risk  factors include hyperlipidemia and diabetes These risk factors were discussed with her in detail Family at the bedside reports she has not been aggressive with her medical care, tries to avoid doctors -Now on aspirin -Case discussed with neurology, they have indicated no indication for transesophageal echo or loop monitor  2) cardiomyopathy Initial echocardiogram underestimated ejection fraction Repeat echocardiogram with Definity showing ejection fraction 50 to 55% Slight decrease in ejection fraction secondary to anteroseptal wall hypokinesis in the setting of bundle branch block -No further inpatient testing needed Would consider outpatient stress testing if any shortness of breath or chest discomfort  Could also consider risk stratification studies such as CT coronary calcium scoring -Several risk factors including diabetes and  hyperlipidemia  3) poorly controlled type 2 diabetes Hemoglobin A1c greater than 9 Had 1 visit with endocrinology did not want to go back as felt they did not do anything cost her $73 -Relying on primary care for aggressive management of her diabetes Poor diet per family Lives alone  4) hyperlipidemia Numbers markedly elevated Started on a statin   Long discussion with patient and family at the bedside Imaging results discussed,  Discussed with hospitalist team, concern about underestimation of ejection fraction on prior echocardiogram, orders placed for repeat echocardiogram with Definity.  Discussed with sonographer New findings discussed with neurology  Total encounter time more than 110 minutes  Greater than 50% was spent in counseling and coordination of care with the patient    For questions or updates, please contact [facility] Please consult [email or web address] for contact info under     Signed, Ida Rogue, MD  09/25/2018 7:29 PM

## 2018-09-25 NOTE — Plan of Care (Signed)
Problem: Education: Goal: Knowledge of General Education information will improve Description Including pain rating scale, medication(s)/side effects and non-pharmacologic comfort measures 09/25/2018 1416 by Rowe Robert, RN Outcome: Progressing 09/25/2018 1210 by Rowe Robert, RN Outcome: Progressing 09/25/2018 1207 by Rowe Robert, RN Outcome: Progressing   Problem: Health Behavior/Discharge Planning: Goal: Ability to manage health-related needs will improve 09/25/2018 1416 by Rowe Robert, RN Outcome: Progressing 09/25/2018 1210 by Rowe Robert, RN Outcome: Progressing 09/25/2018 1207 by Rowe Robert, RN Outcome: Progressing   Problem: Clinical Measurements: Goal: Ability to maintain clinical measurements within normal limits will improve 09/25/2018 1416 by Rowe Robert, RN Outcome: Progressing 09/25/2018 1210 by Rowe Robert, RN Outcome: Progressing 09/25/2018 1207 by Rowe Robert, RN Outcome: Progressing Goal: Will remain free from infection 09/25/2018 1416 by Rowe Robert, RN Outcome: Progressing 09/25/2018 1210 by Rowe Robert, RN Outcome: Progressing 09/25/2018 1207 by Rowe Robert, RN Outcome: Progressing Goal: Diagnostic test results will improve 09/25/2018 1416 by Rowe Robert, RN Outcome: Progressing 09/25/2018 1210 by Rowe Robert, RN Outcome: Progressing 09/25/2018 1207 by Rowe Robert, RN Outcome: Progressing Goal: Respiratory complications will improve 09/25/2018 1416 by Rowe Robert, RN Outcome: Progressing 09/25/2018 1210 by Rowe Robert, RN Outcome: Progressing 09/25/2018 1207 by Rowe Robert, RN Outcome: Progressing Goal: Cardiovascular complication will be avoided 09/25/2018 1416 by Rowe Robert, RN Outcome: Progressing 09/25/2018 1210 by Rowe Robert, RN Outcome: Progressing 09/25/2018 1207 by Rowe Robert, RN Outcome: Progressing   Problem: Activity: Goal: Risk for activity intolerance will decrease 09/25/2018  1416 by Rowe Robert, RN Outcome: Progressing 09/25/2018 1210 by Rowe Robert, RN Outcome: Progressing 09/25/2018 1207 by Rowe Robert, RN Outcome: Progressing   Problem: Nutrition: Goal: Adequate nutrition will be maintained 09/25/2018 1416 by Rowe Robert, RN Outcome: Progressing 09/25/2018 1210 by Rowe Robert, RN Outcome: Progressing 09/25/2018 1207 by Rowe Robert, RN Outcome: Progressing   Problem: Coping: Goal: Level of anxiety will decrease 09/25/2018 1416 by Rowe Robert, RN Outcome: Progressing 09/25/2018 1210 by Rowe Robert, RN Outcome: Progressing 09/25/2018 1207 by Rowe Robert, RN Outcome: Progressing   Problem: Elimination: Goal: Will not experience complications related to bowel motility 09/25/2018 1416 by Rowe Robert, RN Outcome: Progressing 09/25/2018 1210 by Rowe Robert, RN Outcome: Progressing 09/25/2018 1207 by Rowe Robert, RN Outcome: Progressing Goal: Will not experience complications related to urinary retention 09/25/2018 1416 by Rowe Robert, RN Outcome: Progressing 09/25/2018 1210 by Rowe Robert, RN Outcome: Progressing 09/25/2018 1207 by Rowe Robert, RN Outcome: Progressing   Problem: Pain Managment: Goal: General experience of comfort will improve 09/25/2018 1416 by Rowe Robert, RN Outcome: Progressing 09/25/2018 1210 by Rowe Robert, RN Outcome: Progressing 09/25/2018 1207 by Rowe Robert, RN Outcome: Progressing   Problem: Safety: Goal: Ability to remain free from injury will improve 09/25/2018 1416 by Rowe Robert, RN Outcome: Progressing 09/25/2018 1210 by Rowe Robert, RN Outcome: Progressing 09/25/2018 1207 by Rowe Robert, RN Outcome: Progressing   Problem: Skin Integrity: Goal: Risk for impaired skin integrity will decrease 09/25/2018 1416 by Rowe Robert, RN Outcome: Progressing 09/25/2018 1210 by Rowe Robert, RN Outcome: Progressing 09/25/2018 1207 by Rowe Robert, RN Outcome:  Progressing   Problem: Education: Goal: Knowledge of secondary prevention will improve 09/25/2018 1416 by Rowe Robert, RN Outcome: Progressing 09/25/2018 1210 by Rowe Robert, RN Outcome: Progressing 09/25/2018 1207 by Rowe Robert, RN Outcome: Progressing Goal: Knowledge of patient specific risk factors addressed and post discharge goals established will improve 09/25/2018 1416 by Rowe Robert, RN Outcome: Progressing 09/25/2018 1210 by Rowe Robert, RN Outcome: Progressing 09/25/2018 1207 by Rowe Robert, RN Outcome:  Progressing   Problem: Coping: Goal: Will verbalize positive feelings about self 09/25/2018 1416 by Rowe Robert, RN Outcome: Progressing 09/25/2018 1210 by Rowe Robert, RN Outcome: Progressing 09/25/2018 1207 by Rowe Robert, RN Outcome: Progressing   Problem: Health Behavior/Discharge Planning: Goal: Ability to manage health-related needs will improve 09/25/2018 1416 by Rowe Robert, RN Outcome: Progressing 09/25/2018 1210 by Rowe Robert, RN Outcome: Progressing 09/25/2018 1207 by Rowe Robert, RN Outcome: Progressing   Problem: Self-Care: Goal: Ability to participate in self-care as condition permits will improve 09/25/2018 1416 by Rowe Robert, RN Outcome: Progressing 09/25/2018 1210 by Rowe Robert, RN Outcome: Progressing 09/25/2018 1207 by Rowe Robert, RN Outcome: Progressing   Problem: Nutrition: Goal: Risk of aspiration will decrease 09/25/2018 1416 by Rowe Robert, RN Outcome: Progressing 09/25/2018 1210 by Rowe Robert, RN Outcome: Progressing 09/25/2018 1207 by Rowe Robert, RN Outcome: Progressing   Problem: Ischemic Stroke/TIA Tissue Perfusion: Goal: Complications of ischemic stroke/TIA will be minimized 09/25/2018 1416 by Rowe Robert, RN Outcome: Progressing 09/25/2018 1210 by Rowe Robert, RN Outcome: Progressing 09/25/2018 1207 by Rowe Robert, RN Outcome: Progressing

## 2018-09-25 NOTE — Progress Notes (Signed)
Inpatient Diabetes Program Recommendations  AACE/ADA: New Consensus Statement on Inpatient Glycemic Control (2019)  Target Ranges:  Prepandial:   less than 140 mg/dL      Peak postprandial:   less than 180 mg/dL (1-2 hours)      Critically ill patients:  140 - 180 mg/dL   Results for LOURIE, RETZ (MRN [id number]) as of 09/25/2018 08:23  Ref. Range 09/24/2018 07:54 09/24/2018 11:48 09/24/2018 18:37 09/24/2018 21:07 09/25/2018 08:05  Glucose-Capillary Latest Ref Range: 70 - 99 mg/dL 202 (H) 260 (H) 204 (H) 191 (H) 190 (H)   Review of Glycemic Control  Diabetes history: DM2 Outpatient Diabetes medications: Metformin 500 mg BID, Glipizide 10 mg BID Current orders for Inpatient glycemic control: Lantus 10 units QHS, Novolog 0-15 units TID with meals, Novolog 0-5 units QHS  Inpatient Diabetes Program Recommendations:   Insulin - Basal: Please consider increasing Lantus to 15 units QHS.  HgbA1C: Current A1C 9.7% on 09/24/18. Per Care Everywhere, A1C 11.4% on 08/14/18 at last office visit with Dr. Kary Kos.   Outpatient DM medications: Would recommend increasing Metformin to 1000 mg BID as an outpatient. Patient has taken Victoza in the past and tolerated lowest dose. Encouraged patient to talk with PCP about restarting on lowest dose of Victoza.   Thanks, Barnie Alderman, RN, MSN, CDE Diabetes Coordinator Inpatient Diabetes Program [phone] (Team Pager from 8am to 5pm)

## 2018-09-25 NOTE — Progress Notes (Signed)
   09/25/18 1100  Clinical Encounter Type  Visited With Patient and family together  Visit Type Follow-up  Referral From Nurse  Spiritual Encounters  Spiritual Needs Emotional;Other (Comment)  Stress Factors  Patient Stress Factors Loss;Loss of control;Other (Comment)  Family Stress Factors Other (Comment)  Advance Directives (For Healthcare)  Does Patient Have a Medical Advance Directive? No  Would patient like information on creating a medical advance directive? Yes (Inpatient - patient requests chaplain consult to create a medical advance directive)  [facility] Directives  Does Patient Have a Mental Health Advance Directive? No  Would patient like information on creating a mental health advance directive? No - Patient declined

## 2018-09-25 NOTE — Discharge Instructions (Signed)
Outpatient OT

## 2018-09-25 NOTE — Discharge Summary (Addendum)
[facility] [place] NAME: April [name]    MR#:  [id number]  DATE OF BIRTH:  10/07/1941  DATE OF ADMISSION:  09/23/2018   ADMITTING PHYSICIAN: Bettey Costa, MD  DATE OF DISCHARGE: 09/25/2018 PRIMARY CARE PHYSICIAN: Maryland Pink, MD   ADMISSION DIAGNOSIS:  Visual field cut [H53.40] Acute CVA (cerebrovascular accident) ([place]) [I63.9] DISCHARGE DIAGNOSIS:  Active Problems:   Stroke (cerebrum) ([place])  SECONDARY DIAGNOSIS:   Past Medical History:  Diagnosis Date  . B12 deficiency   . Cataracts, both eyes   . Diabetes mellitus without complication ([place])   . Dyslipidemia   . Hypertension    HOSPITAL COURSE:  77 year old female with diabetes who presents with left eyeperipheralvisual loss.  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 Continue aspirin and Pravachol. CTA  1. 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 2. Otherwise no large vessel occlusion, aneurysm, or significant stenosis is identified. Echo: LV EF: 20% - 16%, severe systolic dysfunction.  but per Dr. Rockey Situ, EF is good at 50-55%. No need for TEE per neurologist. Carotid duplex is unremarkable.  2.   Hyperglycemia due to diabetes type II:  Increased Lantus to 15 units at bedtime, continue sliding scale with ADA diet Check A1c 9.7.  Per diabetes coordinator, increasing metformin to 1000 mg twice daily as outpatient.  Follow-up with PCP for restarting low dose of Victoza.  3. Depression: Continue Zoloft  4. Hyperlipidemia: LDL 101. Continue statin.  Hypertension.  Continue lisinopril. Morbid obesity.  Diet control and exercise, follow-up lipid panel in 3 months with PCP.  Discussed with neurologist Dr. Paschal Dopp and Dr. Rockey Situ.  DISCHARGE CONDITIONS:  Stable, discharge to home today. CONSULTS OBTAINED:  Treatment Team:  Catarina Hartshorn, MD Minna Merritts,  MD DRUG ALLERGIES:   Allergies  Allergen Reactions  . Crestor [Rosuvastatin Calcium]   . Lipitor [Atorvastatin]   . Liraglutide   . Sulfa Antibiotics   . Voltaren [Diclofenac Sodium] Nausea Only  . Zocor [Simvastatin] Other (See Comments)    Muscle pain   DISCHARGE MEDICATIONS:   Allergies as of 09/25/2018      Reactions   Crestor [rosuvastatin Calcium]    Lipitor [atorvastatin]    Liraglutide    Sulfa Antibiotics    Voltaren [diclofenac Sodium] Nausea Only   Zocor [simvastatin] Other (See Comments)   Muscle pain      Medication List    STOP taking these medications   amoxicillin-clavulanate 875-125 MG tablet Commonly known as:  AUGMENTIN   ibuprofen 200 MG tablet Commonly known as:  ADVIL,MOTRIN   traMADol 50 MG tablet Commonly known as:  ULTRAM     TAKE these medications   aspirin 81 MG chewable tablet Chew 1 tablet (81 mg total) by mouth daily. Start taking on:  09/26/2018   cyanocobalamin 1000 MCG/ML injection Commonly known as:  (VITAMIN B-12) Inject 1,000 mcg into the muscle every 30 (thirty) days.   ezetimibe 10 MG tablet Commonly known as:  ZETIA Take 1 tablet (10 mg total) by mouth daily.   glipiZIDE 10 MG tablet Commonly known as:  GLUCOTROL Take 10 mg by mouth daily before breakfast.   lisinopril 5 MG tablet Commonly known as:  PRINIVIL,ZESTRIL Take 5 mg by mouth daily.   meloxicam 15 MG tablet Commonly known as:  MOBIC Take 15 mg by mouth daily.   metFORMIN 500 MG tablet Commonly known as:  GLUCOPHAGE Take 2 tablets (1,000 mg  total) by mouth 2 (two) times daily with a meal. What changed:  how much to take   pravastatin 80 MG tablet Commonly known as:  PRAVACHOL Take 80 mg by mouth daily.   sertraline 50 MG tablet Commonly known as:  ZOLOFT Take 50 mg by mouth daily.            Durable Medical Equipment  (From admission, onward)         Start     Ordered   09/25/18 0906  For home use only DME Walker rolling  Once      Question:  Patient needs a walker to treat with the following condition  Answer:  Weakness generalized   09/25/18 0905           DISCHARGE INSTRUCTIONS:  See AVS.  If you experience worsening of your admission symptoms, develop shortness of breath, life threatening emergency, suicidal or homicidal thoughts you must seek medical attention immediately by calling 911 or calling your MD immediately  if symptoms less severe.  You Must read complete instructions/literature along with all the possible adverse reactions/side effects for all the Medicines you take and that have been prescribed to you. Take any new Medicines after you have completely understood and accpet all the possible adverse reactions/side effects.   Please note  You were cared for by a hospitalist during your hospital stay. If you have any questions about your discharge medications or the care you received while you were in the hospital after you are discharged, you can call the unit and asked to speak with the hospitalist on call if the hospitalist that took care of you is not available. Once you are discharged, your primary care physician will handle any further medical issues. Please note that NO REFILLS for any discharge medications will be authorized once you are discharged, as it is imperative that you return to your primary care physician (or establish a relationship with a primary care physician if you do not have one) for your aftercare needs so that they can reassess your need for medications and monitor your lab values.    On the day of Discharge:  VITAL SIGNS:  Blood pressure 122/61, pulse 72, temperature 98.3 F (36.8 C), temperature source Oral, resp. rate 18, height 5\' 2"  (1.575 m), weight 114.8 kg, SpO2 98 %. PHYSICAL EXAMINATION:  GENERAL:  77 y.o.-year-old patient lying in the bed with no acute distress.  EYES: Pupils equal, round, reactive to light and accommodation. No scleral icterus. Extraocular muscles  intact.  HEENT: Head atraumatic, normocephalic. Oropharynx and nasopharynx clear.  NECK:  Supple, no jugular venous distention. No thyroid enlargement, no tenderness.  LUNGS: Normal breath sounds bilaterally, no wheezing, rales,rhonchi or crepitation. No use of accessory muscles of respiration.  CARDIOVASCULAR: S1, S2 normal. No murmurs, rubs, or gallops.  ABDOMEN: Soft, non-tender, non-distended. Bowel sounds present. No organomegaly or mass.  EXTREMITIES: No pedal edema, cyanosis, or clubbing.  NEUROLOGIC: Cranial nerves II through XII are intact. Muscle strength 5/5 in all extremities. Sensation intact. Gait not checked.  PSYCHIATRIC: The patient is alert and oriented x 3.  SKIN: No obvious rash, lesion, or ulcer.  DATA REVIEW:   CBC Recent Labs  Lab 09/23/18 1127  WBC 7.9  HGB 12.9  HCT 40.4  PLT 223    Chemistries  Recent Labs  Lab 09/23/18 1127  NA 137  K 4.9  CL 101  CO2 27  GLUCOSE 394*  BUN 23  CREATININE 1.06*  CALCIUM 9.5  AST 18  ALT 15  ALKPHOS 60  BILITOT 0.7     Microbiology Results  No results found for this or any previous visit.  RADIOLOGY:  No results found.   Management plans discussed with the patient, family and they are in agreement.  CODE STATUS: Full Code   TOTAL TIME TAKING CARE OF THIS PATIENT: 45 minutes.    Demetrios Loll M.D on 09/25/2018 at 1:47 PM  Between 7am to 6pm - Pager - [phone]  After 6pm go to [email or web address] - Proofreader  [facility]  Hospitalists  Office  [phone]  CC: Primary care physician; Maryland Pink, MD   Note: This dictation was prepared with Dragon dictation along with smaller phrase technology. Any transcriptional errors that result from this process are unintentional.

## 2018-09-25 NOTE — Progress Notes (Signed)
Pt for discharge home a/o dr Karl Bales saw. Cardio. Cleared to go. Instructions discussed with pt. presc given and discussed and home meds / diet / activity/ and f/u discussed. Verbalize understanding. Home via w/c w/o c/o with sister.sl d/cd

## 2018-10-11 DIAGNOSIS — I63431 Cerebral infarction due to embolism of right posterior cerebral artery: Secondary | ICD-10-CM | POA: Diagnosis present

## 2018-10-11 DIAGNOSIS — Z6841 Body Mass Index (BMI) 40.0 and over, adult: Secondary | ICD-10-CM

## 2018-12-03 ENCOUNTER — Encounter
Admission: RE | Disposition: A | Discharge status: Home / Self Care | Payer: Self-pay | Source: Home / Self Care | Attending: Cardiology

## 2018-12-03 ENCOUNTER — Other Ambulatory Visit: Payer: Self-pay | Admitting: Cardiology

## 2018-12-03 ENCOUNTER — Ambulatory Visit
Admission: RE | Admit: 2018-12-03 | Discharge: 2018-12-03 | Disposition: A | Discharge status: Home / Self Care | Payer: Medicare HMO | Source: Home / Self Care | Attending: Cardiology | Admitting: Cardiology

## 2018-12-03 ENCOUNTER — Ambulatory Visit
Admission: RE | Admit: 2018-12-03 | Discharge: 2018-12-03 | Disposition: A | Discharge status: Home / Self Care | Payer: Medicare HMO | Attending: Cardiology | Admitting: Cardiology

## 2018-12-03 ENCOUNTER — Other Ambulatory Visit: Payer: Self-pay

## 2018-12-03 DIAGNOSIS — J45909 Unspecified asthma, uncomplicated: Secondary | ICD-10-CM | POA: Diagnosis not present

## 2018-12-03 DIAGNOSIS — I10 Essential (primary) hypertension: Secondary | ICD-10-CM | POA: Diagnosis not present

## 2018-12-03 DIAGNOSIS — I4891 Unspecified atrial fibrillation: Secondary | ICD-10-CM | POA: Diagnosis present

## 2018-12-03 DIAGNOSIS — E669 Obesity, unspecified: Secondary | ICD-10-CM | POA: Diagnosis not present

## 2018-12-03 DIAGNOSIS — Z7982 Long term (current) use of aspirin: Secondary | ICD-10-CM | POA: Diagnosis not present

## 2018-12-03 DIAGNOSIS — Z79899 Other long term (current) drug therapy: Secondary | ICD-10-CM | POA: Diagnosis not present

## 2018-12-03 DIAGNOSIS — E119 Type 2 diabetes mellitus without complications: Secondary | ICD-10-CM | POA: Diagnosis not present

## 2018-12-03 DIAGNOSIS — Z7984 Long term (current) use of oral hypoglycemic drugs: Secondary | ICD-10-CM | POA: Insufficient documentation

## 2018-12-03 DIAGNOSIS — Z6841 Body Mass Index (BMI) 40.0 and over, adult: Secondary | ICD-10-CM | POA: Diagnosis not present

## 2018-12-03 DIAGNOSIS — E785 Hyperlipidemia, unspecified: Secondary | ICD-10-CM | POA: Insufficient documentation

## 2018-12-03 DIAGNOSIS — I639 Cerebral infarction, unspecified: Secondary | ICD-10-CM | POA: Diagnosis not present

## 2018-12-03 DIAGNOSIS — Z87891 Personal history of nicotine dependence: Secondary | ICD-10-CM | POA: Diagnosis not present

## 2018-12-03 HISTORY — PX: TEE WITHOUT CARDIOVERSION: SHX5443

## 2018-12-03 HISTORY — PX: LOOP RECORDER INSERTION: EP1214

## 2018-12-03 LAB — GLUCOSE, CAPILLARY: Glucose-Capillary: 150 mg/dL — ABNORMAL HIGH (ref 70–99)

## 2018-12-03 SURGERY — LOOP RECORDER INSERTION
Anesthesia: LOCAL

## 2018-12-03 SURGERY — ECHOCARDIOGRAM, TRANSESOPHAGEAL
Anesthesia: Moderate Sedation

## 2018-12-03 MED ORDER — SODIUM CHLORIDE FLUSH 0.9 % IV SOLN
INTRAVENOUS | Status: AC
  Filled 2018-12-03: qty 10

## 2018-12-03 MED ORDER — LIDOCAINE-EPINEPHRINE (PF) 1 %-1:200000 IJ SOLN
INTRAMUSCULAR | Status: AC
  Filled 2018-12-03: qty 30

## 2018-12-03 MED ORDER — LIDOCAINE VISCOUS HCL 2 % MT SOLN
OROMUCOSAL | Status: AC
  Administered 2018-12-03: 15 mL
  Filled 2018-12-03: qty 15

## 2018-12-03 MED ORDER — MIDAZOLAM HCL 5 MG/5ML IJ SOLN
INTRAMUSCULAR | Status: AC
  Filled 2018-12-03: qty 5

## 2018-12-03 MED ORDER — SODIUM CHLORIDE 0.9 % IV SOLN
INTRAVENOUS | Status: DC
  Administered 2018-12-03: 1000 mL via INTRAVENOUS

## 2018-12-03 MED ORDER — FENTANYL CITRATE (PF) 100 MCG/2ML IJ SOLN
INTRAMUSCULAR | Status: AC
  Filled 2018-12-03: qty 2

## 2018-12-03 MED ORDER — FENTANYL CITRATE (PF) 100 MCG/2ML IJ SOLN
INTRAMUSCULAR | Status: AC | PRN
  Administered 2018-12-03: 50 ug via INTRAVENOUS

## 2018-12-03 MED ORDER — BUTAMBEN-TETRACAINE-BENZOCAINE 2-2-14 % EX AERO
INHALATION_SPRAY | CUTANEOUS | Status: AC
  Administered 2018-12-03: 3
  Filled 2018-12-03: qty 5

## 2018-12-03 MED ORDER — MIDAZOLAM HCL 2 MG/2ML IJ SOLN
INTRAMUSCULAR | Status: AC | PRN
  Administered 2018-12-03: 2 mg via INTRAVENOUS

## 2018-12-03 SURGICAL SUPPLY — 3 items
LOOP REVEAL LINQSYS (Prosthesis & Implant Heart) ×2 IMPLANT
PACK LOOP INSERTION (CUSTOM PROCEDURE TRAY) ×2 IMPLANT
SLEEVE ISOL F/PACE RF HD COVER (MISCELLANEOUS) ×2 IMPLANT

## 2018-12-03 NOTE — Op Note (Signed)
[facility] Card[facility]   12/03/2018                     11:13 AM  PATIENT:  [name]    PRE-OPERATIVE DIAGNOSIS:  Loop Recorder Insertion   Med[employer] Rep   cc: Brayton El  POST-OPERATIVE DIAGNOSIS:  Same  PROCEDURE:  LOOP RECORDER INSERTION  SURGEON:  Isaias Cowman, MD    ANESTHESIA:     PREOPERATIVE INDICATIONS:  April [name] is a  78 y.o. female with a diagnosis of Loop Recorder Insertion   [employer] Rep   cc: M Godley, S Willey who failed conservative measures and elected for surgical management.    The risks benefits and alternatives were discussed with the patient preoperatively including but not limited to the risks of infection, bleeding, cardiopulmonary complications, the need for revision surgery, among others, and the patient was willing to proceed.   OPERATIVE PROCEDURE: The left pectoral region was prepped and draped in usual sterile manner.  Anesthesia was performed with 1% lidocaine locally.  A 1 cm incision is performed with the incision device.  Reveal Linq loop monitor was implanted utilizing the insertion device.  The incision was closed with Steri-Strips and Tegaderm was placed.  This procedural interrogation revealed R wave amplitude is 0.16 mV.  No periprocedural complications.

## 2018-12-03 NOTE — Procedures (Signed)
   TRANSESOPHAGEAL ECHOCARDIOGRAM   NAME:  April [name]   MRN: [id number] DOB:  Jan 04, 1941   ADMIT DATE: 12/03/2018  INDICATIONS:   PROCEDURE:   Informed consent was obtained prior to the procedure. The risks, benefits and alternatives for the procedure were discussed and the patient comprehended these risks.  Risks include, but are not limited to, cough, sore throat, vomiting, nausea, somnolence, esophageal and stomach trauma or perforation, bleeding, low blood pressure, aspiration, pneumonia, infection, trauma to the teeth and death.    After a procedural time-out, the patient was given 2 mg versed and 50 mcg fentanyl to achieve moderate sedation for 10 min.  The oropharynx was anesthetized 3 cc of topical 1% viscous lidocaine.  The transesophageal probe was inserted in the esophagus and stomach without difficulty and multiple views were obtained. I was present for the entire procedure.    COMPLICATIONS:    There were no immediate complications.  FINDINGS:  LEFT VENTRICLE: EF = 55%. No regional wall motion abnormalities.  RIGHT VENTRICLE: Normal size and function.   LEFT ATRIUM: No thrombus.  LEFT ATRIAL APPENDAGE: No thrombus. Good flow  RIGHT ATRIUM: No thrombus  AORTIC VALVE:  Trileaflet. Trivial ai No vegetations or thrombi  MITRAL VALVE:    Normal. Mild mr. No vegetations or thrombi  TRICUSPID VALVE: Normal. No vegetations or thrombi  PULMONIC VALVE: Grossly normal.  INTERATRIAL SEPTUM: No PFO or ASD. Agitated saline contrast was used.   PERICARDIUM: No effusion  DESCENDING AORTA:  Normal CONCLUSION: Normal lv funciton No cardiac source of emboli

## 2018-12-03 NOTE — Progress Notes (Signed)
*  PRELIMINARY RESULTS* Echocardiogram Echocardiogram Transesophageal has been performed.  Sherrie Sport 12/03/2018, 10:44 AM

## 2019-01-04 ENCOUNTER — Emergency Department
Admission: EM | Admit: 2019-01-04 | Discharge: 2019-01-04 | Disposition: A | Discharge status: Home / Self Care | Payer: Medicare HMO | Attending: Emergency Medicine | Admitting: Emergency Medicine

## 2019-01-04 ENCOUNTER — Emergency Department: Payer: Medicare HMO

## 2019-01-04 ENCOUNTER — Other Ambulatory Visit: Payer: Self-pay

## 2019-01-04 DIAGNOSIS — E119 Type 2 diabetes mellitus without complications: Secondary | ICD-10-CM | POA: Insufficient documentation

## 2019-01-04 DIAGNOSIS — Z7982 Long term (current) use of aspirin: Secondary | ICD-10-CM | POA: Insufficient documentation

## 2019-01-04 DIAGNOSIS — G44209 Tension-type headache, unspecified, not intractable: Secondary | ICD-10-CM

## 2019-01-04 DIAGNOSIS — Z79899 Other long term (current) drug therapy: Secondary | ICD-10-CM | POA: Insufficient documentation

## 2019-01-04 DIAGNOSIS — I10 Essential (primary) hypertension: Secondary | ICD-10-CM | POA: Diagnosis not present

## 2019-01-04 DIAGNOSIS — R51 Headache: Secondary | ICD-10-CM | POA: Diagnosis present

## 2019-01-04 LAB — CBC
HCT: 39.1 % (ref 36.0–46.0)
Hemoglobin: 12.3 g/dL (ref 12.0–15.0)
MCH: 28.4 pg (ref 26.0–34.0)
MCHC: 31.5 g/dL (ref 30.0–36.0)
MCV: 90.3 fL (ref 80.0–100.0)
Platelets: 217 10*3/uL (ref 150–400)
RBC: 4.33 MIL/uL (ref 3.87–5.11)
RDW: 13.2 % (ref 11.5–15.5)
WBC: 8.7 10*3/uL (ref 4.0–10.5)
nRBC: 0 % (ref 0.0–0.2)

## 2019-01-04 LAB — BASIC METABOLIC PANEL
Anion gap: 8 (ref 5–15)
BUN: 24 mg/dL — ABNORMAL HIGH (ref 8–23)
CO2: 24 mmol/L (ref 22–32)
Calcium: 9 mg/dL (ref 8.9–10.3)
Chloride: 106 mmol/L (ref 98–111)
Creatinine, Ser: 1.05 mg/dL — ABNORMAL HIGH (ref 0.44–1.00)
GFR calc non Af Amer: 52 mL/min — ABNORMAL LOW (ref >60)
Glucose, Bld: 212 mg/dL — ABNORMAL HIGH (ref 70–99)
Potassium: 4.5 mmol/L (ref 3.5–5.1)
Sodium: 138 mmol/L (ref 135–145)

## 2019-01-04 MED ORDER — TRAMADOL HCL 50 MG PO TABS: 50.0000 mg | ORAL_TABLET | Freq: Four times a day (QID) | ORAL | 0 refills | Status: AC | PRN

## 2019-01-04 NOTE — ED Notes (Signed)
Pt alert and oriented X4, active, cooperative, pt in NAD. RR even and unlabored, color WNL.  Pt informed to return if any life threatening symptoms occur.  Discharge and followup instructions reviewed. Ambulates safely. 

## 2019-01-04 NOTE — ED Notes (Signed)
Verbal orders received from Dr. Kerman Passey

## 2019-01-04 NOTE — ED Triage Notes (Addendum)
Pt c/o L sided neck pain and HA since Friday. Denies injury. Denies hx of HA. States has had a stroke before. Speech clear. Moving all extremities on own. No droop noted. Ambulatory.  Denies blood thinner use. Denies vision change.

## 2019-01-04 NOTE — ED Provider Notes (Signed)
[facility] Emergency Department Provider Note   ____________________________________________    I have reviewed the triage vital signs and the nursing notes.   HISTORY  Chief Complaint Headache     HPI April [name] is a 78 y.o. female who presents with complaints of headache.  Patient describes left-sided posterior scalp and neck discomfort with occasional radiation along the left side of her head.  She reports this started after waking up 2 days ago.  Worse when turning her head.  No falls or injury.  No neuro deficits.  She is concerned because she has a stroke in the past.  No fevers or chills.  No nausea or vomiting.  Not on blood thinners.  Has taken ibuprofen with some relief  Past Medical History:  Diagnosis Date  . B12 deficiency   . Cataracts, both eyes   . Diabetes mellitus without complication ([employer])   . Dyslipidemia   . Hypertension     Patient Active Problem List   Diagnosis Date Noted  . 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 CATARACT EXTRACTION    . COLONOSCOPY    . COLONOSCOPY WITH PROPOFOL N/A 07/04/2015   Procedure: COLONOSCOPY WITH PROPOFOL;  Surgeon: Josefine Class, MD;  Location: [facility] ENDOSCOPY;  Service: Endoscopy;  Laterality: N/A;  . EYE SURGERY    . LOOP RECORDER INSERTION N/A 12/03/2018   Procedure: LOOP RECORDER INSERTION;  Surgeon: Isaias Cowman, MD;  Location: [place] CV LAB;  Service: Cardiovascular;  Laterality: N/A;  . TEE WITHOUT CARDIOVERSION N/A 12/03/2018   Procedure: TRANSESOPHAGEAL ECHOCARDIOGRAM (TEE);  Surgeon: Teodoro Spray, MD;  Location: [facility] ORS;  Service: Cardiovascular;  Laterality: N/A;    Prior to Admission medications   Medication Sig Start Date End Date Taking? Authorizing Provider  aspirin EC 81 MG tablet Take 81 mg by mouth daily.    [provider]  cyanocobalamin (,VITAMIN B-12,) 1000 MCG/ML injection Inject  1,000 mcg into the muscle every 30 (thirty) days. 02/18/14   [provider]  ezetimibe (ZETIA) 10 MG tablet Take 1 tablet (10 mg total) by mouth daily. 09/25/18 09/25/19  Demetrios Loll, MD  glipiZIDE (GLUCOTROL) 10 MG tablet Take 10 mg by mouth 2 (two) times daily.     [provider]  lisinopril (PRINIVIL,ZESTRIL) 5 MG tablet Take 5 mg by mouth daily.    [provider]  meloxicam (MOBIC) 15 MG tablet Take 15 mg by mouth daily.    [provider]  metFORMIN (GLUCOPHAGE) 500 MG tablet Take 2 tablets (1,000 mg total) by mouth 2 (two) times daily with a meal. 09/25/18   Demetrios Loll, MD  pravastatin (PRAVACHOL) 80 MG tablet Take 80 mg by mouth every evening.     [provider]  sertraline (ZOLOFT) 50 MG tablet Take 50 mg by mouth daily.    [provider]  traMADol (ULTRAM) 50 MG tablet Take 1 tablet (50 mg total) by mouth every 6 (six) hours as needed. 01/04/19 01/04/20  Lavonia Drafts, MD     Allergies Crestor [rosuvastatin calcium]; Lipitor [atorvastatin]; Liraglutide; Sulfa antibiotics; Voltaren [diclofenac sodium]; and Zocor [simvastatin]  Family History  Problem Relation Age of Onset  . Breast cancer Sister 35    Social History Social History   Tobacco Use  . Smoking status: Former Research scientist (life sciences)  . Smokeless tobacco: Never Used  Substance Use Topics  . Alcohol use: No  . Drug use: Never  Review of Systems  Constitutional: No fever/chills Eyes: No visual changes.  ENT: No sore throat. Cardiovascular: Denies chest pain. Respiratory: Denies shortness of breath. Gastrointestinal:   No nausea, no vomiting.   Genitourinary: Negative for dysuria. Musculoskeletal: Negative for back pain. Skin: Negative for rash. Neurological: As above   ____________________________________________   PHYSICAL EXAM:  VITAL SIGNS: ED Triage Vitals  Enc Vitals Group     BP 01/04/19 1317 (!) 149/68     Pulse Rate 01/04/19 1317 92     Resp 01/04/19  1317 16     Temp 01/04/19 1317 98.2 F (36.8 C)     Temp Source 01/04/19 1317 Oral     SpO2 01/04/19 1317 99 %     Weight 01/04/19 1318 113.4 kg (250 lb)     Height 01/04/19 1318 1.575 m (5\' 2" )     Head Circumference --      Peak Flow --      Pain Score 01/04/19 1318 8     Pain Loc --      Pain Edu? --      Excl. in [place]? --     Constitutional: Alert and oriented.  Eyes: Conjunctivae are normal.  Head: Atraumatic. Nose: No congestion/rhinnorhea. Mouth/Throat: Mucous membranes are moist.   Neck: Point tenderness to palpation at the left trapezius insertion site on the back of the scalp, she is tender in this area and she describes pain that occurs there when she rotates her head especially to the right.  No erythema inflammation or rash. Cardiovascular: Normal rate, regular rhythm. Grossly normal heart sounds.  Good peripheral circulation. Respiratory: Normal respiratory effort.  No retractions. Lungs CTAB.   Musculoskeletal:   See above Neurologic:  Normal speech and language. No gross focal neurologic deficits are appreciated.  Normal strength in all extremities Skin:  Skin is warm, dry and intact. No rash noted.   ____________________________________________   LABS (all labs ordered are listed, but only abnormal results are displayed)  Labs Reviewed  BASIC METABOLIC PANEL - Abnormal; Notable for the following components:      Result Value   Glucose, Bld 212 (*)    BUN 24 (*)    Creatinine, Ser 1.05 (*)    GFR calc non Af Amer 52 (*)    All other components within normal limits  CBC   ____________________________________________  EKG  None ____________________________________________  RADIOLOGY  None ____________________________________________   PROCEDURES  Procedure(s) performed: No  Procedures   Critical Care performed: No ____________________________________________   INITIAL IMPRESSION / ASSESSMENT AND PLAN / ED COURSE  Pertinent labs &  imaging results that were available during my care of the patient were reviewed by me and considered in my medical decision making (see chart for details).  Patient well-appearing and in no acute distress.  Differential includes nonspecific headache, giant cell arteritis, tension headache, less likely CVA  Patient's presentation not consistent with CVA or giant cell arteritis, no temporal tenderness to palpation, no changes in vision.  Lab work is reassuring, CT head is unremarkable.  Given point tenderness at trapezius insertion site suspect tension headache is the cause of her pain, this would explain radiation to the left scalp as well.  Will provide analgesia, recommend continued ibuprofen outpatient follow-up if no improvement in 3 to 4 days.    ____________________________________________   FINAL CLINICAL IMPRESSION(S) / ED DIAGNOSES  Final diagnoses:  Acute non intractable tension-type headache        Note:  This document was  prepared using Systems analyst and may include unintentional dictation errors.   Lavonia Drafts, MD 01/04/19 [phone]

## 2019-03-27 ENCOUNTER — Other Ambulatory Visit: Payer: Self-pay | Admitting: Family Medicine

## 2019-03-27 DIAGNOSIS — Z1231 Encounter for screening mammogram for malignant neoplasm of breast: Secondary | ICD-10-CM

## 2019-05-08 ENCOUNTER — Ambulatory Visit
Admission: RE | Admit: 2019-05-08 | Discharge: 2019-05-08 | Disposition: A | Discharge status: Home / Self Care | Payer: Medicare HMO | Source: Ambulatory Visit | Attending: Family Medicine | Admitting: Family Medicine

## 2019-05-08 ENCOUNTER — Other Ambulatory Visit: Payer: Self-pay

## 2019-05-08 DIAGNOSIS — Z1231 Encounter for screening mammogram for malignant neoplasm of breast: Secondary | ICD-10-CM | POA: Diagnosis not present

## 2019-05-26 DIAGNOSIS — I480 Paroxysmal atrial fibrillation: Secondary | ICD-10-CM | POA: Diagnosis present

## 2020-10-24 DIAGNOSIS — I358 Other nonrheumatic aortic valve disorders: Secondary | ICD-10-CM | POA: Diagnosis present

## 2020-11-22 ENCOUNTER — Other Ambulatory Visit: Payer: Self-pay | Admitting: Family Medicine

## 2020-11-22 DIAGNOSIS — E041 Nontoxic single thyroid nodule: Secondary | ICD-10-CM

## 2020-12-06 ENCOUNTER — Ambulatory Visit
Admission: RE | Admit: 2020-12-06 | Discharge: 2020-12-06 | Disposition: A | Discharge status: Home / Self Care | Payer: Medicare HMO | Source: Ambulatory Visit | Attending: Family Medicine | Admitting: Family Medicine

## 2020-12-06 ENCOUNTER — Other Ambulatory Visit: Payer: Self-pay

## 2020-12-06 DIAGNOSIS — E041 Nontoxic single thyroid nodule: Secondary | ICD-10-CM | POA: Insufficient documentation

## 2020-12-07 ENCOUNTER — Other Ambulatory Visit: Payer: Self-pay | Admitting: Family Medicine

## 2020-12-07 DIAGNOSIS — Z1231 Encounter for screening mammogram for malignant neoplasm of breast: Secondary | ICD-10-CM

## 2021-04-20 ENCOUNTER — Emergency Department: Payer: Medicare HMO

## 2021-04-20 ENCOUNTER — Observation Stay
Admission: EM | Admit: 2021-04-20 | Discharge: 2021-04-21 | Disposition: A | Discharge status: Home / Self Care | Payer: Medicare HMO | Attending: Internal Medicine | Admitting: Internal Medicine

## 2021-04-20 ENCOUNTER — Other Ambulatory Visit: Payer: Self-pay

## 2021-04-20 ENCOUNTER — Encounter: Payer: Self-pay | Admitting: Emergency Medicine

## 2021-04-20 ENCOUNTER — Observation Stay: Payer: Medicare HMO

## 2021-04-20 DIAGNOSIS — Z7982 Long term (current) use of aspirin: Secondary | ICD-10-CM | POA: Insufficient documentation

## 2021-04-20 DIAGNOSIS — I358 Other nonrheumatic aortic valve disorders: Secondary | ICD-10-CM | POA: Diagnosis not present

## 2021-04-20 DIAGNOSIS — E7849 Other hyperlipidemia: Secondary | ICD-10-CM | POA: Diagnosis present

## 2021-04-20 DIAGNOSIS — Z7901 Long term (current) use of anticoagulants: Secondary | ICD-10-CM | POA: Diagnosis not present

## 2021-04-20 DIAGNOSIS — R079 Chest pain, unspecified: Secondary | ICD-10-CM | POA: Diagnosis not present

## 2021-04-20 DIAGNOSIS — Z20822 Contact with and (suspected) exposure to covid-19: Secondary | ICD-10-CM | POA: Diagnosis not present

## 2021-04-20 DIAGNOSIS — I10 Essential (primary) hypertension: Secondary | ICD-10-CM | POA: Diagnosis not present

## 2021-04-20 DIAGNOSIS — Z7984 Long term (current) use of oral hypoglycemic drugs: Secondary | ICD-10-CM | POA: Diagnosis not present

## 2021-04-20 DIAGNOSIS — I480 Paroxysmal atrial fibrillation: Secondary | ICD-10-CM

## 2021-04-20 DIAGNOSIS — R4182 Altered mental status, unspecified: Secondary | ICD-10-CM | POA: Insufficient documentation

## 2021-04-20 DIAGNOSIS — I63431 Cerebral infarction due to embolism of right posterior cerebral artery: Secondary | ICD-10-CM

## 2021-04-20 DIAGNOSIS — R072 Precordial pain: Secondary | ICD-10-CM | POA: Diagnosis not present

## 2021-04-20 DIAGNOSIS — Z6841 Body Mass Index (BMI) 40.0 and over, adult: Secondary | ICD-10-CM

## 2021-04-20 DIAGNOSIS — Z79899 Other long term (current) drug therapy: Secondary | ICD-10-CM | POA: Diagnosis not present

## 2021-04-20 DIAGNOSIS — Z87891 Personal history of nicotine dependence: Secondary | ICD-10-CM | POA: Diagnosis not present

## 2021-04-20 DIAGNOSIS — E119 Type 2 diabetes mellitus without complications: Secondary | ICD-10-CM

## 2021-04-20 LAB — BASIC METABOLIC PANEL
Anion gap: 6 (ref 5–15)
BUN: 24 mg/dL — ABNORMAL HIGH (ref 8–23)
CO2: 27 mmol/L (ref 22–32)
Calcium: 9.3 mg/dL (ref 8.9–10.3)
Chloride: 104 mmol/L (ref 98–111)
Creatinine, Ser: 1.14 mg/dL — ABNORMAL HIGH (ref 0.44–1.00)
GFR, Estimated: 49 mL/min — ABNORMAL LOW (ref >60)
Glucose, Bld: 276 mg/dL — ABNORMAL HIGH (ref 70–99)
Potassium: 4.9 mmol/L (ref 3.5–5.1)
Sodium: 137 mmol/L (ref 135–145)

## 2021-04-20 LAB — TROPONIN I (HIGH SENSITIVITY)
Troponin I (High Sensitivity): 6 ng/L (ref <18)
Troponin I (High Sensitivity): 7 ng/L (ref <18)
Troponin I (High Sensitivity): 6 ng/L (ref <18)
Troponin I (High Sensitivity): 7 ng/L (ref <18)

## 2021-04-20 LAB — LIPID PANEL
Cholesterol: 144 mg/dL (ref 0–200)
HDL: 43 mg/dL (ref >40)
LDL Cholesterol: 62 mg/dL (ref 0–99)
Total CHOL/HDL Ratio: 3.3 RATIO
Triglycerides: 196 mg/dL — ABNORMAL HIGH (ref <150)
VLDL: 39 mg/dL (ref 0–40)

## 2021-04-20 LAB — CBC
HCT: 37.6 % (ref 36.0–46.0)
Hemoglobin: 12.5 g/dL (ref 12.0–15.0)
MCH: 30 pg (ref 26.0–34.0)
MCHC: 33.2 g/dL (ref 30.0–36.0)
MCV: 90.4 fL (ref 80.0–100.0)
Platelets: 173 10*3/uL (ref 150–400)
RBC: 4.16 MIL/uL (ref 3.87–5.11)
RDW: 12.7 % (ref 11.5–15.5)
WBC: 6.5 10*3/uL (ref 4.0–10.5)
nRBC: 0 % (ref 0.0–0.2)

## 2021-04-20 LAB — CBG MONITORING, ED: Glucose-Capillary: 262 mg/dL — ABNORMAL HIGH (ref 70–99)

## 2021-04-20 MED ORDER — ACETAMINOPHEN 325 MG PO TABS: 650.0000 mg | ORAL_TABLET | ORAL | Status: DC | PRN

## 2021-04-20 MED ORDER — METOPROLOL TARTRATE 25 MG PO TABS
25.0000 mg | ORAL_TABLET | Freq: Two times a day (BID) | ORAL | Status: DC
  Administered 2021-04-20 – 2021-04-21 (×2): 25 mg via ORAL
  Filled 2021-04-20 (×2): qty 1

## 2021-04-20 MED ORDER — SERTRALINE HCL 50 MG PO TABS
50.0000 mg | ORAL_TABLET | Freq: Every day | ORAL | Status: DC
  Administered 2021-04-21: 50 mg via ORAL
  Filled 2021-04-20: qty 1

## 2021-04-20 MED ORDER — INSULIN ASPART 100 UNIT/ML IJ SOLN
0.0000 [IU] | Freq: Three times a day (TID) | INTRAMUSCULAR | Status: DC
  Administered 2021-04-21: 3 [IU] via SUBCUTANEOUS
  Administered 2021-04-21: 5 [IU] via SUBCUTANEOUS
  Filled 2021-04-20 (×2): qty 1

## 2021-04-20 MED ORDER — LISINOPRIL 5 MG PO TABS
5.0000 mg | ORAL_TABLET | Freq: Every day | ORAL | Status: DC
  Administered 2021-04-21: 5 mg via ORAL
  Filled 2021-04-20 (×2): qty 1

## 2021-04-20 MED ORDER — ASPIRIN EC 81 MG PO TBEC: 81.0000 mg | DELAYED_RELEASE_TABLET | Freq: Every day | ORAL | Status: DC

## 2021-04-20 MED ORDER — PRAVASTATIN SODIUM 40 MG PO TABS
80.0000 mg | ORAL_TABLET | Freq: Every evening | ORAL | Status: DC
  Filled 2021-04-20: qty 2

## 2021-04-20 MED ORDER — SERTRALINE HCL 50 MG PO TABS: 50.0000 mg | ORAL_TABLET | Freq: Every day | ORAL | Status: DC

## 2021-04-20 MED ORDER — ASPIRIN EC 81 MG PO TBEC
81.0000 mg | DELAYED_RELEASE_TABLET | Freq: Every day | ORAL | Status: DC
  Administered 2021-04-21: 81 mg via ORAL
  Filled 2021-04-20: qty 1

## 2021-04-20 MED ORDER — ASPIRIN 81 MG PO CHEW
324.0000 mg | CHEWABLE_TABLET | Freq: Once | ORAL | Status: AC
  Administered 2021-04-20: 324 mg via ORAL
  Filled 2021-04-20: qty 4

## 2021-04-20 MED ORDER — CYANOCOBALAMIN 1000 MCG/ML IJ SOLN: 1000.0000 ug | INTRAMUSCULAR | Status: DC

## 2021-04-20 MED ORDER — NITROGLYCERIN 0.4 MG SL SUBL
0.4000 mg | SUBLINGUAL_TABLET | SUBLINGUAL | Status: DC | PRN
  Administered 2021-04-20 (×2): 0.4 mg via SUBLINGUAL
  Filled 2021-04-20: qty 1

## 2021-04-20 MED ORDER — EZETIMIBE 10 MG PO TABS
10.0000 mg | ORAL_TABLET | Freq: Every day | ORAL | Status: DC
  Administered 2021-04-21: 10 mg via ORAL
  Filled 2021-04-20 (×2): qty 1

## 2021-04-20 MED ORDER — APIXABAN 5 MG PO TABS
5.0000 mg | ORAL_TABLET | Freq: Two times a day (BID) | ORAL | Status: DC
  Administered 2021-04-20 – 2021-04-21 (×2): 5 mg via ORAL
  Filled 2021-04-20 (×2): qty 1

## 2021-04-20 MED ORDER — INSULIN ASPART 100 UNIT/ML IJ SOLN
0.0000 [IU] | Freq: Every day | INTRAMUSCULAR | Status: DC
  Administered 2021-04-20: 3 [IU] via SUBCUTANEOUS
  Filled 2021-04-20: qty 1

## 2021-04-20 MED ORDER — MELOXICAM 7.5 MG PO TABS
15.0000 mg | ORAL_TABLET | Freq: Every day | ORAL | Status: DC
  Administered 2021-04-21: 15 mg via ORAL
  Filled 2021-04-20 (×3): qty 2

## 2021-04-20 MED ORDER — ONDANSETRON HCL 4 MG/2ML IJ SOLN: 4.0000 mg | Freq: Four times a day (QID) | INTRAMUSCULAR | Status: DC | PRN

## 2021-04-20 MED ORDER — GLIPIZIDE 10 MG PO TABS
10.0000 mg | ORAL_TABLET | Freq: Two times a day (BID) | ORAL | Status: DC
  Administered 2021-04-21: 10 mg via ORAL
  Filled 2021-04-20 (×2): qty 1

## 2021-04-20 NOTE — ED Notes (Signed)
Pt reports mild improvement of neck paion after nitro administration. Pt states that pain decreased from 8/10 to 5-6/10

## 2021-04-20 NOTE — H&P (Signed)
History and Physical   April [name]:[id number] DOB: [birth year] DOA: 04/20/2021  Referring MD/NP/PA: Dr. Duffy Bruce  PCP: [place] Pink, MD   Outpatient Specialists: None  Patient coming from: Home  Chief Complaint: Chest pain  HPI: April [name] is a 80 y.o. female with medical history significant of essential hypertension, diabetes, hyperlipidemia, B12 deficiency and Chronic who presents to the ER with substernal chest pain rated as 7 out of 10.  Pain was more pressure radiating to her left chin and elbow as well as upper arm.  Symptoms have been going on and off for the last 3 days.  This is worsening involving most of the neck.  It all started while she was in the mall at the time.  She started feeling shortness of breath and some diaphoresis.  At that time she did not have chest pain.  Over the last 2 days however she has progressively noted exertional dyspnea and not chest discomfort.  No fever or chills.  Denied any cough.  Denies any sick contacts.  Work-up in the ER showed no obvious pneumonia or PE.  At this point patient is being admitted for work-up of possible ACS.  ED Course: Temperature 98 blood pressure 150/61, pulse 74 respirate of 18 oxygen sat 97% on room air.  CBC entirely within normal.  Chemistry showed sodium 137 potassium 4.8 chloride 104 CO2 27 BUN 24 creatinine 1.14 and calcium 9.3.  Glucose is 276.  Initial cardiac enzymes so far negative.  Head CT without contrast showed no acute findings.  EKG shows normal sinus rhythm with left bundle branch block which is present in EKG back from December 2019.   Review of Systems: As per HPI otherwise 10 point review of systems negative.    Past Medical History:  Diagnosis Date   B12 deficiency    Cataracts, both eyes    Diabetes mellitus without complication ([place])    Dyslipidemia    Hypertension     Past Surgical History:  Procedure Laterality Date   CATARACT EXTRACTION     COLONOSCOPY     COLONOSCOPY WITH  PROPOFOL N/A 07/04/2015   Procedure: COLONOSCOPY WITH PROPOFOL;  Surgeon: Josefine Class, MD;  Location[facility] ENDOSCOPY;  Service: Endoscopy;  Laterality: N/A;   EYE SURGERY     LOOP RECORDER INSERTION N/A 12/03/2018   Procedure: LOOP RECORDER INSERTION;  Surgeon: Isaias Cowman, MD;  Location: [place] CV LAB;  Service: Cardiovascular;  Laterality: N/A;   TEE WITHOUT CARDIOVERSION N/A 12/03/2018   Procedure: TRANSESOPHAGEAL ECHOCARDIOGRAM (TEE);  Surgeon: Teodoro Spray, MD;  Location: [facility] ORS;  Service: Cardiovascular;  Laterality: N/A;     reports that she has quit smoking. She has never used smokeless tobacco. She reports that she does not drink alcohol and does not use drugs.  Allergies  Allergen Reactions   Crestor [Rosuvastatin Calcium] Other (See Comments)    Muscle aches/leg pains   Lipitor [Atorvastatin] Other (See Comments)    Muscle aches/leg pains    Liraglutide Nausea Only    Victoza    Sulfa Antibiotics Other (See Comments)    Childhood allergy   Voltaren [Diclofenac Sodium] Nausea Only   Zocor [Simvastatin] Other (See Comments)    Muscle pain    Family History  Problem Relation Age of Onset   Breast cancer Sister 60     Prior to Admission medications   Medication Sig Start Date End Date Taking? Authorizing Provider  apixaban (ELIQUIS) 5 MG TABS tablet Take  1 tablet by mouth every 12 (twelve) hours. 11/22/20  Yes [provider]  cyanocobalamin (,VITAMIN B-12,) 1000 MCG/ML injection Inject 1,000 mcg into the muscle every 30 (thirty) days. 02/18/14  Yes [provider]  ezetimibe (ZETIA) 10 MG tablet Take 1 tablet by mouth daily. 05/12/20  Yes [provider]  glipiZIDE (GLUCOTROL) 10 MG tablet Take 10 mg by mouth 2 (two) times daily.    Yes [provider]  lisinopril (PRINIVIL,ZESTRIL) 5 MG tablet Take 5 mg by mouth daily.   Yes [provider]  meloxicam (MOBIC) 15 MG tablet Take 15 mg by mouth daily.    Yes [provider]  metFORMIN (GLUCOPHAGE) 500 MG tablet Take 2 tablets (1,000 mg total) by mouth 2 (two) times daily with a meal. 09/25/18  Yes Demetrios Loll, MD  metoprolol tartrate (LOPRESSOR) 25 MG tablet Take 1 tablet by mouth 2 (two) times daily. 10/24/20 10/24/21 Yes [provider]  pravastatin (PRAVACHOL) 80 MG tablet Take 80 mg by mouth every evening.    Yes [provider]  sertraline (ZOLOFT) 50 MG tablet Take 50 mg by mouth daily.   Yes [provider]  TRULICITY 3.71 GG/2.6RS SOPN SMARTSIG:0.5 Milliliter(s) SUB-Q Once a Week 01/16/21  Yes [provider]  aspirin EC 81 MG tablet Take 81 mg by mouth daily. Patient not taking: Reported on 04/20/2021    [provider]  ezetimibe (ZETIA) 10 MG tablet Take 1 tablet (10 mg total) by mouth daily. 09/25/18 09/25/19  Demetrios Loll, MD    Physical Exam: Vitals:   04/20/21 1531 04/20/21 1534  BP: (!) 150/61   Pulse: 74   Resp: 18   Temp: 98 F (36.7 C)   TempSrc: Oral   SpO2: 97%   Weight:  113 kg  Height:  5\' 2"  (1.575 m)      Constitutional: Morbidly obese, pleasant, no distress Vitals:   04/20/21 1531 04/20/21 1534  BP: (!) 150/61   Pulse: 74   Resp: 18   Temp: 98 F (36.7 C)   TempSrc: Oral   SpO2: 97%   Weight:  113 kg  Height:  5\' 2"  (1.575 m)   Eyes: PERRL, lids and conjunctivae normal ENMT: Mucous membranes are moist. Posterior pharynx clear of any exudate or lesions.Normal dentition.  Neck: normal, supple, no masses, no thyromegaly Respiratory: clear to auscultation bilaterally, no wheezing, no crackles. Normal respiratory effort. No accessory muscle use.  Cardiovascular: Regular rate and rhythm, no murmurs / rubs / gallops. No extremity edema. 2+ pedal pulses. No carotid bruits.  Abdomen: no tenderness, no masses palpated. No hepatosplenomegaly. Bowel sounds positive.  Musculoskeletal: no clubbing / cyanosis. No joint deformity upper and lower extremities. Good ROM,  no contractures. Normal muscle tone.  Skin: no rashes, lesions, ulcers. No induration Neurologic: CN 2-12 grossly intact. Sensation intact, DTR normal. Strength 5/5 in all 4.  Psychiatric: Normal judgment and insight. Alert and oriented x 3. Normal mood.     Labs on Admission: I have personally reviewed following labs and imaging studies  CBC: Recent Labs  Lab 04/20/21 1535  WBC 6.5  HGB 12.5  HCT 37.6  MCV 90.4  PLT 854   Basic Metabolic Panel: Recent Labs  Lab 04/20/21 1535  NA 137  K 4.9  CL 104  CO2 27  GLUCOSE 276*  BUN 24*  CREATININE 1.14*  CALCIUM 9.3   GFR: Estimated Creatinine Clearance: 48.3 mL/min (A) (by C-G formula based on SCr of 1.14 mg/dL (  H)). Liver Function Tests: No results for input(s): AST, ALT, ALKPHOS, BILITOT, PROT, ALBUMIN in the last 168 hours. No results for input(s): LIPASE, AMYLASE in the last 168 hours. No results for input(s): AMMONIA in the last 168 hours. Coagulation Profile: No results for input(s): INR, PROTIME in the last 168 hours. Cardiac Enzymes: No results for input(s): CKTOTAL, CKMB, CKMBINDEX, TROPONINI in the last 168 hours. BNP (last 3 results) No results for input(s): PROBNP in the last 8760 hours. HbA1C: No results for input(s): HGBA1C in the last 72 hours. CBG: No results for input(s): GLUCAP in the last 168 hours. Lipid Profile: No results for input(s): CHOL, HDL, LDLCALC, TRIG, CHOLHDL, LDLDIRECT in the last 72 hours. Thyroid Function Tests: No results for input(s): TSH, T4TOTAL, FREET4, T3FREE, THYROIDAB in the last 72 hours. Anemia Panel: No results for input(s): VITAMINB12, FOLATE, FERRITIN, TIBC, IRON, RETICCTPCT in the last 72 hours. Urine analysis: No results found for: COLORURINE, APPEARANCEUR, LABSPEC, PHURINE, GLUCOSEU, HGBUR, BILIRUBINUR, KETONESUR, PROTEINUR, UROBILINOGEN, NITRITE, LEUKOCYTESUR Sepsis Labs: @LABRCNTIP (procalcitonin:4,lacticidven:4) )No results found for this or any previous visit  (from the past 240 hour(s)).   Radiological Exams on Admission: DG Chest 2 View  Result Date: 04/20/2021 CLINICAL DATA:  Chest pain EXAM: CHEST - 2 VIEW COMPARISON:  04/12/2011 FINDINGS: Electronic device over the left chest wall. No focal opacity or pleural effusion. Normal cardiomediastinal silhouette. Stable calcified right middle lobe nodule consistent with granuloma. No pneumothorax. Aortic atherosclerosis. Hardware in the cervical spine. IMPRESSION: No active cardiopulmonary disease. Electronically Signed   By: Donavan Foil M.D.   On: 04/20/2021 16:12    EKG: Independently reviewed.  Sinus rhythm with a rate of 80.  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  Could be unstable angina.  Patient will be admitted.  Given nitroglycerin.  Continue statin, metoprolol.  Cycle enzymes.  Get echo in the morning.  Patient may benefit from cardiac stress testing prior to discharge.  #2 paroxysmal atrial fibrillation: On Eliquis and metoprolol.  We will continue  #3 essential hypertension: Patient is on lisinopril and metoprolol.  #4 non-insulin-dependent diabetes: Hold metformin and continue with glipizide and sliding scale insulin  #5 history of aortic sclerosis: Get echocardiogram and monitor  #6 hyperlipidemia: On Pravachol 80 mg nightly.  Will resume  #7 morbid obesity: Dietary counseling   DVT prophylaxis: Eliquis Code Status: Full code Family Communication: None family at bedside Disposition Plan: Home Consults called:  None but cardiology may be consulted in the morning Admission status: Observation  Severity of Illness: The appropriate patient status for this patient is OBSERVATION. Observation status is judged to be reasonable and necessary in order to provide the required intensity of service to ensure the patient's safety. The patient's presenting symptoms, physical exam findings, and initial radiographic and laboratory data in the context of their medical condition is felt to place them at decreased risk for further clinical deterioration. Furthermore, it is anticipated that the patient will be medically stable for discharge from the hospital within 2 midnights  of admission. The following factors support the patient status of observation.   " The patient's presenting symptoms include chest pain. " The physical exam findings include morbidly obese otherwise no other findings. " The initial radiographic and laboratory data are normal troponins and EKG.   Barbette Merino MD [facility] Pager [phone]  If 7PM-7AM, please contact night-coverage [email or web address] Password [email or web address]  04/20/2021, 7:21 PM

## 2021-04-20 NOTE — ED Provider Notes (Signed)
[facility] Emergency Department Provider Note  ____________________________________________   Event Date/Time   First MD Initiated Contact with Patient 04/20/21 1707     (approximate)  I have reviewed the triage vital signs and the nursing notes.   HISTORY  Chief Complaint Chest Pain    HPI April [name] is a 80 y.o. female  with h/o HTN, DM, stroke, here wth chest and neck pain. Pt reports that over the past 3 days, she has had worsening neck pain, chest pain, SOB. Reports sx began [place] the mall 3 [place], experienced acute onset of significant diaphoresis, SOB, but no CP. Sx resolved w/ rest. Over the past 2 days, she has now developed associated intermittent aching, pressure like neck pain along with SOB. Sx have also spread to her left upper chest, which is an intermittent but sharper pain. Sx seem worse w/ exertion. No alleviating factors. Denies any recent trauma or heavy lifting/twisting.        Past Medical History:  Diagnosis Date   B12 deficiency    Cataracts, both eyes    Diabetes mellitus without complication ([place])    Dyslipidemia    Hypertension     Patient Active Problem List   Diagnosis Date Noted   Chest pain 04/20/2021   Aortic valve sclerosis 10/24/2020   Paroxysmal atrial fibrillation ([place]) 05/26/2019   Class 3 severe obesity with serious comorbidity and body mass index (BMI) of 45.0 to 49.9 in adult [facility]) 10/11/2018   Hx of Cerebral infarction due to embolism of right posterior cerebral artery ([place]) 10/11/2018   Stroke (cerebrum) ([place]) 09/23/2018   Essential hypertension 03/26/2014   Other hyperlipidemia 03/26/2014   Type II diabetes mellitus ([place]) 03/26/2014    Past Surgical History:  Procedure Laterality Date   CATARACT EXTRACTION     COLONOSCOPY     COLONOSCOPY WITH PROPOFOL N/A 07/04/2015   Procedure: COLONOSCOPY WITH PROPOFOL;  Surgeon: Josefine Class, MD;  Location: [facility] ENDOSCOPY;  Service:  Endoscopy;  Laterality: N/A;   EYE SURGERY     LOOP RECORDER INSERTION N/A 12/03/2018   Procedure: LOOP RECORDER INSERTION;  Surgeon: Isaias Cowman, MD;  Location: [place] CV LAB;  Service: Cardiovascular;  Laterality: N/A;   TEE WITHOUT CARDIOVERSION N/A 12/03/2018   Procedure: TRANSESOPHAGEAL ECHOCARDIOGRAM (TEE);  Surgeon: Teodoro Spray, MD;  Location: [facility] ORS;  Service: Cardiovascular;  Laterality: N/A;    Prior to Admission medications   Medication Sig Start Date End Date Taking? Authorizing Provider  apixaban (ELIQUIS) 5 MG TABS tablet Take 1 tablet by mouth every 12 (twelve) hours. 11/22/20  Yes [provider]  cyanocobalamin (,VITAMIN B-12,) 1000 MCG/ML injection Inject 1,000 mcg into the muscle every 30 (thirty) days. 02/18/14  Yes [provider]  ezetimibe (ZETIA) 10 MG tablet Take 1 tablet by mouth daily. 05/12/20  Yes [provider]  glipiZIDE (GLUCOTROL) 10 MG tablet Take 10 mg by mouth 2 (two) times daily.    Yes [provider]  lisinopril (PRINIVIL,ZESTRIL) 5 MG tablet Take 5 mg by mouth daily.   Yes [provider]  meloxicam (MOBIC) 15 MG tablet Take 15 mg by mouth daily.   Yes [provider]  metFORMIN (GLUCOPHAGE) 500 MG tablet Take 2 tablets (1,000 mg total) by mouth 2 (two) times daily with a meal. 09/25/18  Yes Demetrios Loll, MD  metoprolol tartrate (LOPRESSOR) 25 MG tablet Take 1 tablet by mouth 2 (two) times daily. 10/24/20 10/24/21 Yes  [provider]  pravastatin (PRAVACHOL) 80 MG tablet Take 80 mg by mouth every evening.    Yes [provider]  sertraline (ZOLOFT) 50 MG tablet Take 50 mg by mouth daily.   Yes [provider]  TRULICITY 5.46 EV/0.3JK SOPN SMARTSIG:0.5 Milliliter(s) SUB-Q Once a Week 01/16/21  Yes [provider]  aspirin EC 81 MG tablet Take 81 mg by mouth daily. Patient not taking: Reported on 04/20/2021    [provider]  ezetimibe (ZETIA) 10 MG  tablet Take 1 tablet (10 mg total) by mouth daily. 09/25/18 09/25/19  Demetrios Loll, MD    Allergies Crestor [rosuvastatin calcium], Lipitor [atorvastatin], Liraglutide, Sulfa antibiotics, Voltaren [diclofenac sodium], and Zocor [simvastatin]  Family History  Problem Relation Age of Onset   Breast cancer Sister 47    Social History Social History   Tobacco Use   Smoking status: Former    Pack years: 0.00   Smokeless tobacco: Never  Vaping Use   Vaping Use: Never used  Substance Use Topics   Alcohol use: No   Drug use: Never    Review of Systems  Review of Systems  Constitutional:  Positive for fatigue. Negative for chills and fever.  HENT:  Negative for sore throat.   Respiratory:  Positive for chest tightness and shortness of breath.   Cardiovascular:  Negative for chest pain.  Gastrointestinal:  Negative for abdominal pain.  Genitourinary:  Negative for flank pain.  Musculoskeletal:  Positive for neck pain and neck stiffness.  Skin:  Negative for rash and wound.  Allergic/Immunologic: Negative for immunocompromised state.  Neurological:  Positive for weakness. Negative for numbness.  Hematological:  Does not bruise/bleed easily.  All other systems reviewed and are negative.   ____________________________________________  PHYSICAL EXAM:      VITAL SIGNS: ED Triage Vitals  Enc Vitals Group     BP 04/20/21 1531 (!) 150/61     Pulse Rate 04/20/21 1531 74     Resp 04/20/21 1531 18     Temp 04/20/21 1531 98 F (36.7 C)     Temp Source 04/20/21 1531 Oral     SpO2 04/20/21 1531 97 %     Weight 04/20/21 1534 249 lb 1.9 oz (113 kg)     Height 04/20/21 1534 5\' 2"  (1.575 m)     Head Circumference --      Peak Flow --      Pain Score 04/20/21 1534 8     Pain Loc --      Pain Edu? --      Excl. in [place]? --      Physical Exam Vitals and nursing note reviewed.  Constitutional:      General: She is not in acute distress.    Appearance: She is well-developed.  HENT:      Head: Normocephalic and atraumatic.  Eyes:     Conjunctiva/sclera: Conjunctivae normal.  Cardiovascular:     Rate and Rhythm: Normal rate and regular rhythm.     Heart sounds: Normal heart sounds. No murmur heard.   No friction rub.  Pulmonary:     Effort: Pulmonary effort is normal. No respiratory distress.     Breath sounds: Normal breath sounds. No wheezing or rales.  Abdominal:     General: There is no distension.     Palpations: Abdomen is soft.     Tenderness: There is no abdominal tenderness.  Musculoskeletal:     Cervical back: Neck supple.  Skin:  General: Skin is warm.     Capillary Refill: Capillary refill takes less than 2 seconds.  Neurological:     Mental Status: She is alert and oriented to person, place, and time.     Motor: No abnormal muscle tone.      ____________________________________________   LABS (all labs ordered are listed, but only abnormal results are displayed)  Labs Reviewed  BASIC METABOLIC PANEL - Abnormal; Notable for the following components:      Result Value   Glucose, Bld 276 (*)    BUN 24 (*)    Creatinine, Ser 1.14 (*)    GFR, Estimated 49 (*)    All other components within normal limits  CBC  TROPONIN I (HIGH SENSITIVITY)  TROPONIN I (HIGH SENSITIVITY)    ____________________________________________  EKG: Normal sinus rhythm, VR 76. PR 150, QRS 130, QTc 468. No acute St elevations or depressions. LBBB noted.  ________________________________________  RADIOLOGY All imaging, including plain films, CT scans, and ultrasounds, independently reviewed by me, and interpretations confirmed via formal radiology reads.  ED MD interpretation:   CXR: Clear  Official radiology report(s): DG Chest 2 View  Result Date: 04/20/2021 CLINICAL DATA:  Chest pain EXAM: CHEST - 2 VIEW COMPARISON:  04/12/2011 FINDINGS: Electronic device over the left chest wall. No focal opacity or pleural effusion. Normal cardiomediastinal silhouette.  Stable calcified right middle lobe nodule consistent with granuloma. No pneumothorax. Aortic atherosclerosis. Hardware in the cervical spine. IMPRESSION: No active cardiopulmonary disease. Electronically Signed   By: Donavan Foil M.D.   On: 04/20/2021 16:12    ____________________________________________  PROCEDURES   Procedure(s) performed (including Critical Care):  Procedures  ____________________________________________  INITIAL IMPRESSION / MDM / [place] / ED COURSE  As part of my medical decision making, I reviewed the following data within the [place] notes reviewed and incorporated, Old chart reviewed, Notes from prior ED visits, and [place] Controlled Substance Database       *ELLON MARASCO was evaluated in Emergency Department on 04/20/2021 for the symptoms described in the history of present illness. She was evaluated in the context of the global COVID-19 pandemic, which necessitated consideration that the patient might be at risk for infection with the SARS-CoV-2 virus that causes COVID-19. Institutional protocols and algorithms that pertain to the evaluation of patients at risk for COVID-19 are in a state of rapid change based on information released by regulatory bodies including the CDC and federal and state organizations. These policies and algorithms were followed during the patient's care in the ED.  Some ED evaluations and interventions may be delayed as a result of limited staffing during the pandemic.*     Medical Decision Making:  80 yo F here with fairly concerning story for possible angina. Pt has neck pain, diaphoresis, improved w/ nitro with HEART score of 7. EKG is nonischemic here, trop negative. Labs o/w reassuring. CXR reviewed and is clear. Given improvement with nitro, high risk history, will admit for obs and consideration of further testing.  ____________________________________________  FINAL CLINICAL IMPRESSION(S) / ED  DIAGNOSES  Final diagnoses:  Chest pain with high risk for cardiac etiology     MEDICATIONS GIVEN DURING THIS VISIT:  Medications  nitroGLYCERIN (NITROSTAT) SL tablet 0.4 mg (0.4 mg Sublingual Given 04/20/21 1818)  aspirin chewable tablet 324 mg (has no administration in time range)     ED Discharge Orders     None        Note:  This document was prepared using Dragon voice recognition software and may include unintentional dictation errors.   Duffy Bruce, MD 04/20/21 [phone]

## 2021-04-20 NOTE — ED Triage Notes (Signed)
Pt comes into the ED via POV c/o left side chest pain that was accompanied with [facility] and nausea.  PT states the pain started last night.  Pt also admits to left side neck pain.  Pt admits to a significant heart history.  Pt currently in NAD with even and unlabored respirations.  Pt admits to some diaphoresis yesterday.

## 2021-04-21 ENCOUNTER — Observation Stay
Admit: 2021-04-21 | Discharge: 2021-04-21 | Disposition: A | Discharge status: Home / Self Care | Payer: Medicare HMO | Attending: Internal Medicine | Admitting: Internal Medicine

## 2021-04-21 DIAGNOSIS — R079 Chest pain, unspecified: Secondary | ICD-10-CM

## 2021-04-21 LAB — ECHOCARDIOGRAM COMPLETE
AR max vel: 1.26 cm2
AV Area VTI: 1.33 cm2
AV Area mean vel: 1.3 cm2
AV Mean grad: 6 mmHg
AV Peak grad: 10.4 mmHg
Ao pk vel: 1.61 m/s
Area-P 1/2: 2.32 cm2
Height: 62 in
MV VTI: 0.94 cm2
S' Lateral: 3.2 cm
Weight: 3985.92 oz

## 2021-04-21 LAB — GLUCOSE, CAPILLARY
Glucose-Capillary: 246 mg/dL — ABNORMAL HIGH (ref 70–99)
Glucose-Capillary: 195 mg/dL — ABNORMAL HIGH (ref 70–99)

## 2021-04-21 LAB — SARS CORONAVIRUS 2 (TAT 6-24 HRS): SARS Coronavirus 2: NEGATIVE

## 2021-04-21 MED ORDER — CYCLOBENZAPRINE HCL 10 MG PO TABS: 5.0000 mg | ORAL_TABLET | Freq: Three times a day (TID) | ORAL | Status: DC | PRN

## 2021-04-21 MED ORDER — INSULIN GLARGINE 100 UNIT/ML ~~LOC~~ SOLN
10.0000 [IU] | Freq: Every day | SUBCUTANEOUS | Status: DC
  Filled 2021-04-21: qty 0.1

## 2021-04-21 MED ORDER — CYCLOBENZAPRINE HCL 5 MG PO TABS: 5.0000 mg | ORAL_TABLET | Freq: Three times a day (TID) | ORAL | 0 refills | Status: AC | PRN

## 2021-04-21 NOTE — Discharge Summary (Signed)
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)   Type II diabetes mellitus [facility])    Chief Complaint  Patient presents with   Chest Pain   Brief narrative: April [name] is a 80 y.o. female with PMH significant for DM2, HTN, HLD, vitamin B12 deficiency. Patient presented to the ED on 6/30 with complaint of substernal chest pain, 7 out of 10, radiated to the left chin and elbow, upper arm.  Symptoms ongoing for the 3 days.  Associated with shortness of breath and diaphoresis as well as progressive exertional dyspnea.  In the ED, blood pressure was elevated 150/61. Labs with normal CBC, chemistry with BUN 24, creatinine 1.4, glucose level elevated to 76 Initial cardiograms negative. CT head without contrast normal EKG with normal sinus rhythm, left bundle branch block similar to EKG from 2019 Patient was admitted to hospitalist service for further evaluation management  Subjective: Patient was seen and examined this morning.  Pleasant elderly Caucasian female.  Lying in bed.  Not in distress.  No chest pain but had recurrence of pain in the back of the neck again this morning.    Hospital course: Chest pain -Presented with substernal chest pain with radiation and associated shortness of breath and diaphoresis -Placed in observation for possible ACS -Troponin negative so  far. -Echocardiogram showed EF 60 to 65% with no wall motion abnormality. -No history of stent or bypass in the past.  Reports negative stress test few years ago. -No need of further ischemic evaluation in the hospital. -Discharged home to follow-up with her cardiologist Dr. Ubaldo Glassing as an outpatient. -She takes Eliquis and Pravachol at home. Recent Labs    04/20/21 1818 04/20/21 2032 04/20/21 2315  TROPONINIHS 7 6 7    Neck pain -Patient's primary problem seems to be clear to back of the neck which is intermittent and probably related to muscle spasm due to change in posture or sleep pattern. -We will try her on a tiny dose of Flexeril as needed.  Prescription given for limited supply.  Paroxysmal A. Fib -On metoprolol and Eliquis  Essential hypertension -On metoprolol and lisinopril at home to continue.  Type 2 diabetes mellitus -A1c 9.7 on 2019. -Home meds include metformin and glipizide.  She was also supposed to be taking Trulicity but states that it is expensive. -Currently blood sugar level is running in 200s. -I instructed her to follow-up with her primary care provider as she may benefit from insulin therapy. Recent Labs  Lab 04/20/21 2141 04/21/21 0803 04/21/21 1123  GLUCAP 262* 246* 195*   Hyperlipidemia History of stroke -Eliquis and Pravachol 80 mg nightly.    Morbid obesity  -Body mass index is 45.56 kg/m. Patient has been advised to make an attempt to improve diet and exercise patterns to aid in weight loss.   Wound care:    Discharge Exam:   Vitals:   04/20/21 2301 04/21/21  0522 04/21/21 0803 04/21/21 1103  BP: (!) 124/92 (!) 152/80 (!) 143/67 (!) 112/97  Pulse: 72 78 64 62  Resp: 18 18    Temp: 97.7 F (36.5 C) 98.1 F (36.7 C) 98 F (36.7 C) 98.4 F (36.9 C)  TempSrc:   Oral Oral  SpO2: 97% 99% 97% 98%  Weight:      Height:        Body mass index is 45.56 kg/m.  General exam: Pleasant, elderly Caucasian female.  Not in physical  distress Skin: No rashes, lesions or ulcers. HEENT: Atraumatic, normocephalic, no obvious bleeding, mild pain at the back of the neck on passive movement Lungs: Clear to auscultation bilaterally CVS: Regular rate and rhythm, no murmur GI/Abd soft, nontender, nondistended, bowel sound present CNS: Alert, awake, oriented x3 Psychiatry: Mood appropriate Extremities: No pedal edema, no calf tenderness  Follow ups:   Discharge Instructions     Diet - low sodium heart healthy   Complete by: As directed    Diet Carb Modified   Complete by: As directed    Increase activity slowly   Complete by: As directed        Follow-up Information     Maryland Pink, MD Follow up.   Specialty: Family Medicine Contact information: [address] [place] [address] [phone]         Teodoro Spray, MD Follow up.   Specialty: Cardiology Contact information: [place] [address] [phone]                 Recommendations for Outpatient Follow-Up:   Follow-up with PCP Follow-up with cardiology  Discharge Instructions:  Follow with Primary MD Maryland Pink, MD in 7 days   Get CBC/BMP checked in next visit within 1 week by PCP or SNF MD ( we routinely change or add medications that can affect your baseline labs and fluid status, therefore we recommend that you get the mentioned basic workup next visit with your PCP, your PCP may decide not to get them or add new tests based on their clinical decision)  On your next visit with your PCP, please Get Medicines reviewed and adjusted.  Please request your PCP  to go over all Hospital Tests and Procedure/Radiological results at the follow up, please get all Hospital records sent to your Prim MD by signing hospital release before you go home.  Activity: As tolerated with Full fall precautions use walker/cane & assistance as needed  For Heart failure patients - Check your Weight same time everyday, if you  gain over 2 pounds, or you develop in leg swelling, experience more shortness of breath or chest pain, call your Primary MD immediately. Follow Cardiac Low Salt Diet and 1.5 lit/day fluid restriction.  If you have smoked or chewed Tobacco in the last 2 yrs please stop smoking, stop any regular Alcohol  and or any Recreational drug use.  If you experience worsening of your admission symptoms, develop shortness of breath, life threatening emergency, suicidal or homicidal thoughts you must seek medical attention immediately by calling 911 or calling your MD immediately  if symptoms less severe.  You Must read complete instructions/literature along with all the possible adverse reactions/side effects for all the Medicines you take and that have been prescribed to you. Take any new Medicines after you have completely understood and accpet all the possible adverse reactions/side effects.   Do not drive, operate heavy machinery, perform activities at heights, swimming or participation  in water activities or provide baby sitting services if your were admitted for syncope or siezures until you have seen by Primary MD or a Neurologist and advised to do so again.  Do not drive when taking Pain medications.  Do not take more than prescribed Pain, Sleep and Anxiety Medications  Wear Seat belts while driving.   Please note You were cared for by a hospitalist during your hospital stay. If you have any questions about your discharge medications or the care you received while you were in the hospital after you are discharged, you can call the unit and asked to speak with the hospitalist on call if the hospitalist that took care of you is not available. Once you are discharged, your primary care physician will handle any further medical issues. Please note that NO REFILLS for any discharge medications will be authorized once you are discharged, as it is imperative that you return to your primary care physician (or  establish a relationship with a primary care physician if you do not have one) for your aftercare needs so that they can reassess your need for medications and monitor your lab values.    Time coordinating discharge: 35 minutes  Allergies as of 04/21/2021       Reactions   Crestor [rosuvastatin Calcium] Other (See Comments)   Muscle aches/leg pains   Lipitor [atorvastatin] Other (See Comments)   Muscle aches/leg pains   Liraglutide Nausea Only   Victoza    Sulfa Antibiotics Other (See Comments)   Childhood allergy   Voltaren [diclofenac Sodium] Nausea Only   Zocor [simvastatin] Other (See Comments)   Muscle pain        Medication List     STOP taking these medications    aspirin EC 81 MG tablet       TAKE these medications    apixaban 5 MG Tabs tablet Commonly known as: ELIQUIS Take 1 tablet by mouth every 12 (twelve) hours.   cyanocobalamin 1000 MCG/ML injection Commonly known as: (VITAMIN B-12) Inject 1,000 mcg into the muscle every 30 (thirty) days.   cyclobenzaprine 5 MG tablet Commonly known as: FLEXERIL Take 1 tablet (5 mg total) by mouth 3 (three) times daily as needed for up to 3 days for muscle spasms.   ezetimibe 10 MG tablet Commonly known as: ZETIA Take 1 tablet by mouth daily. What changed: Another medication with the same name was removed. Continue taking this medication, and follow the directions you see here.   glipiZIDE 10 MG tablet Commonly known as: GLUCOTROL Take 10 mg by mouth 2 (two) times daily.   lisinopril 5 MG tablet Commonly known as: ZESTRIL Take 5 mg by mouth daily.   meloxicam 15 MG tablet Commonly known as: MOBIC Take 15 mg by mouth daily.   metFORMIN 500 MG tablet Commonly known as: GLUCOPHAGE Take 2 tablets (1,000 mg total) by mouth 2 (two) times daily with a meal.   metoprolol tartrate 25 MG tablet Commonly known as: LOPRESSOR Take 1 tablet by mouth 2 (two) times daily.   pravastatin 80 MG tablet Commonly known  as: PRAVACHOL Take 80 mg by mouth every evening.   sertraline 50 MG tablet Commonly known as: ZOLOFT Take 50 mg by mouth daily.   Trulicity 2.13 YQ/6.5HQ Sopn Generic drug: Dulaglutide SMARTSIG:0.5 Milliliter(s) SUB-Q Once a Week        The results of significant diagnostics from this hospitalization (including imaging, microbiology, ancillary and laboratory) are listed below for reference.  Procedures and Diagnostic Studies:   ECHOCARDIOGRAM COMPLETE  Result Date: 04/21/2021    ECHOCARDIOGRAM REPORT   Patient Name:   April [name] Date of Exam: 04/21/2021 Medical Rec #:  [id number]     Height:       62.0 in Accession #:    [id number]    Weight:       249.1 lb Date of Birth:  June 20, 1941     BSA:          2.099 m Patient Age:    54 years      BP:           143/67 mmHg Patient Gender: F             HR:           62 bpm. Exam Location:  [facility] Procedure: 2D Echo, Color Doppler and Cardiac Doppler Indications:     R07.9 Chest Pain  History:         Patient has prior history of Echocardiogram examinations.                  Stroke; Risk Factors:Hypertension, Diabetes and Dyslipidemia.  Sonographer:     Charmayne Sheer RDCS (AE) Referring Phys:  [place] Diagnosing Phys: Bartholome Bill MD  Sonographer Comments: Suboptimal parasternal window. Image acquisition challenging due to patient body habitus. IMPRESSIONS  1. Left ventricular ejection fraction, by estimation, is 60 to 65%. The left ventricle has normal function. The left ventricle has no regional wall motion abnormalities. There is moderate left ventricular hypertrophy. Left ventricular diastolic parameters are consistent with Grade I diastolic dysfunction (impaired relaxation).  2. Right ventricular systolic function is normal. The right ventricular size is mildly enlarged.  3. Left atrial size was mildly dilated.  4. Right atrial size was mildly dilated.  5. The mitral valve was not well visualized. Trivial mitral valve regurgitation.  Moderate mitral annular calcification.  6. The aortic valve was not well visualized. Aortic valve regurgitation is trivial. FINDINGS  Left Ventricle: Left ventricular ejection fraction, by estimation, is 60 to 65%. The left ventricle has normal function. The left ventricle has no regional wall motion abnormalities. The left ventricular internal cavity size was normal in size. There is  moderate left ventricular hypertrophy. Left ventricular diastolic parameters are consistent with Grade I diastolic dysfunction (impaired relaxation). Right Ventricle: The right ventricular size is mildly enlarged. No increase in right ventricular wall thickness. Right ventricular systolic function is normal. Left Atrium: Left atrial size was mildly dilated. Right Atrium: Right atrial size was mildly dilated. Pericardium: There is no evidence of pericardial effusion. Mitral Valve: The mitral valve was not well visualized. Moderate mitral annular calcification. Trivial mitral valve regurgitation. MV peak gradient, 7.8 mmHg. The mean mitral valve gradient is 4.0 mmHg. Tricuspid Valve: The tricuspid valve is not well visualized. Tricuspid valve regurgitation is mild. Aortic Valve: The aortic valve was not well visualized. Aortic valve regurgitation is trivial. Aortic valve mean gradient measures 6.0 mmHg. Aortic valve peak gradient measures 10.4 mmHg. Aortic valve area, by VTI measures 1.33 cm. Pulmonic Valve: The pulmonic valve was not well visualized. Pulmonic valve regurgitation is trivial. Aorta: The aortic root was not well visualized. IAS/Shunts: The atrial septum is grossly normal.  LEFT VENTRICLE PLAX 2D LVIDd:         5.00 cm  Diastology LVIDs:         3.20 cm  LV e' medial:    5.77 cm/s LV PW:  1.00 cm  LV E/e' medial:  23.4 LV IVS:        1.00 cm  LV e' lateral:   8.49 cm/s LVOT diam:     1.70 cm  LV E/e' lateral: 15.9 LV SV:         49 LV SV Index:   23 LVOT Area:     2.27 cm  RIGHT VENTRICLE RV Basal diam:  2.40 cm  TAPSE (M-mode): 2.4 cm LEFT ATRIUM             Index       RIGHT ATRIUM           Index LA diam:        3.80 cm 1.81 cm/m  RA Area:     11.90 cm LA Vol (A2C):   50.6 ml 24.11 ml/m RA Volume:   27.80 ml  13.25 ml/m LA Vol (A4C):   32.8 ml 15.63 ml/m LA Biplane Vol: 42.3 ml 20.16 ml/m  AORTIC VALVE                    PULMONIC VALVE AV Area (Vmax):    1.26 cm     PV Vmax:       0.85 m/s AV Area (Vmean):   1.30 cm     PV Vmean:      58.900 cm/s AV Area (VTI):     1.33 cm     PV VTI:        0.188 m AV Vmax:           161.00 cm/s  PV Peak grad:  2.9 mmHg AV Vmean:          115.000 cm/s PV Mean grad:  2.0 mmHg AV VTI:            0.364 m AV Peak Grad:      10.4 mmHg AV Mean Grad:      6.0 mmHg LVOT Vmax:         89.50 cm/s LVOT Vmean:        66.000 cm/s LVOT VTI:          0.214 m LVOT/AV VTI ratio: 0.59  AORTA Ao Root diam: 2.40 cm MITRAL VALVE                TRICUSPID VALVE MV Area (PHT): 2.32 cm     TR Peak grad:   26.4 mmHg MV Area VTI:   0.94 cm     TR Vmax:        257.00 cm/s MV Peak grad:  7.8 mmHg MV Mean grad:  4.0 mmHg     SHUNTS MV Vmax:       1.40 m/s     Systemic VTI:  0.21 m MV Vmean:      96.4 cm/s    Systemic Diam: 1.70 cm MV Decel Time: 327 msec MV E velocity: 135.00 cm/s MV A velocity: 124.00 cm/s MV E/A ratio:  1.09 Bartholome Bill MD Electronically signed by Bartholome Bill MD Signature Date/Time: 04/21/2021/12:25:23 PM    Final      Labs:   Basic Metabolic Panel: Recent Labs  Lab 04/20/21 1535  NA 137  K 4.9  CL 104  CO2 27  GLUCOSE 276*  BUN 24*  CREATININE 1.14*  CALCIUM 9.3   GFR Estimated Creatinine Clearance: 48.3 mL/min (A) (by C-G formula based on SCr of 1.14 mg/dL (H)). Liver Function Tests: No results for input(s): AST, ALT, ALKPHOS, BILITOT, PROT, ALBUMIN  in the last 168 hours. No results for input(s): LIPASE, AMYLASE in the last 168 hours. No results for input(s): AMMONIA in the last 168 hours. Coagulation profile No results for input(s): INR, PROTIME in the last  168 hours.  CBC: Recent Labs  Lab 04/20/21 1535  WBC 6.5  HGB 12.5  HCT 37.6  MCV 90.4  PLT 173   Cardiac Enzymes: No results for input(s): CKTOTAL, CKMB, CKMBINDEX, TROPONINI in the last 168 hours. BNP: Invalid input(s): POCBNP CBG: Recent Labs  Lab 04/20/21 2141 04/21/21 0803 04/21/21 1123  GLUCAP 262* 246* 195*   D-Dimer No results for input(s): DDIMER in the last 72 hours. Hgb A1c No results for input(s): HGBA1C in the last 72 hours. Lipid Profile Recent Labs    04/20/21 2035  CHOL 144  HDL 43  LDLCALC 62  TRIG 196*  CHOLHDL 3.3   Thyroid function studies No results for input(s): TSH, T4TOTAL, T3FREE, THYROIDAB in the last 72 hours.  Invalid input(s): FREET3 Anemia work up No results for input(s): VITAMINB12, FOLATE, FERRITIN, TIBC, IRON, RETICCTPCT in the last 72 hours. Microbiology Recent Results (from the past 240 hour(s))  SARS CORONAVIRUS 2 (TAT 6-24 HRS) Nasopharyngeal Nasopharyngeal Swab     Status: None   Collection Time: 04/20/21  9:44 PM   Specimen: Nasopharyngeal Swab  Result Value Ref Range Status   SARS Coronavirus 2 NEGATIVE NEGATIVE Final    Comment: (NOTE) SARS-CoV-2 target nucleic acids are NOT DETECTED.  The SARS-CoV-2 RNA is generally detectable in upper and lower respiratory specimens during the acute phase of infection. Negative results do not preclude SARS-CoV-2 infection, do not rule out co-infections with other pathogens, and should not be used as the sole basis for treatment or other patient management decisions. Negative results must be combined with clinical observations, patient history, and epidemiological information. The expected result is Negative.  Fact Sheet for Patients: SugarRoll.be  Fact Sheet for Healthcare Providers: https://www.woods-mathews.com/  This test is not yet approved or cleared by the [place] FDA and  has been authorized for detection and/or  diagnosis of SARS-CoV-2 by FDA under an Emergency Use Authorization (EUA). This EUA will remain  in effect (meaning this test can be used) for the duration of the COVID-19 declaration under Se ction 564(b)(1) of the Act, 21 U.S.C. section 360bbb-3(b)(1), unless the authorization is terminated or revoked sooner.  Performed at [facility], [place] [address], [place], [place] [address]      Signed: Terrilee Croak  [facility] 04/21/2021, 12:51 PM

## 2021-04-21 NOTE — Progress Notes (Signed)
Inpatient Diabetes Program Recommendations  AACE/ADA: New Consensus Statement on Inpatient Glycemic Control (2015)  Target Ranges:  Prepandial:   less than 140 mg/dL      Peak postprandial:   less than 180 mg/dL (1-2 hours)      Critically ill patients:  140 - 180 mg/dL   Lab Results  Component Value Date   GLUCAP 246 (H) 04/21/2021   HGBA1C 9.7 (H) 09/24/2018    Review of Glycemic Control Results for April [name], April [name] (MRN [id number]) as of 04/21/2021 11:05  Ref. Range 04/20/2021 21:41 04/21/2021 08:03  Glucose-Capillary Latest Ref Range: 70 - 99 mg/dL 262 (H) 246 (H)   Diabetes history: DM2 Outpatient Diabetes medications: Glucotrol 10 mg bid + Metformin 1 gm bid Current orders for Inpatient glycemic control: Glucotrol 10 mg bid + Novolog 0-15 units tid + 0-5 units hs  Inpatient Diabetes Program Recommendations:   While in the hospital: -Hold oral DM meds  -Add Lantus 16 units qd while oral meds held. (0.15 units/kg x 113 kg = 17 units) Secure chat sent to Dr. Pietro Cassis.  Thank you, Nani Gasser. Genieve Ramaswamy, RN, MSN, CDE  Diabetes Coordinator Inpatient Glycemic Control Team Team Pager [phone] (8am-5pm) 04/21/2021 11:16 AM

## 2021-04-21 NOTE — Progress Notes (Signed)
*  PRELIMINARY RESULTS* Echocardiogram 2D Echocardiogram has been performed.  April [name] 04/21/2021, 11:12 AM

## 2021-04-22 LAB — HEMOGLOBIN A1C
Hgb A1c MFr Bld: 9.9 % — ABNORMAL HIGH (ref 4.8–5.6)
Mean Plasma Glucose: 237 mg/dL

## 2021-11-29 ENCOUNTER — Other Ambulatory Visit: Payer: Self-pay | Admitting: Family Medicine

## 2021-11-29 DIAGNOSIS — Z1231 Encounter for screening mammogram for malignant neoplasm of breast: Secondary | ICD-10-CM

## 2022-01-15 ENCOUNTER — Ambulatory Visit
Admission: RE | Admit: 2022-01-15 | Discharge: 2022-01-15 | Disposition: A | Discharge status: Home / Self Care | Payer: Medicare HMO | Source: Ambulatory Visit | Attending: Family Medicine | Admitting: Family Medicine

## 2022-01-15 ENCOUNTER — Other Ambulatory Visit: Payer: Self-pay

## 2022-01-15 DIAGNOSIS — Z1231 Encounter for screening mammogram for malignant neoplasm of breast: Secondary | ICD-10-CM | POA: Insufficient documentation

## 2022-03-13 ENCOUNTER — Emergency Department: Payer: Medicare HMO

## 2022-03-13 ENCOUNTER — Emergency Department
Admission: EM | Admit: 2022-03-13 | Discharge: 2022-03-13 | Disposition: A | Discharge status: Home / Self Care | Payer: Medicare HMO | Attending: Emergency Medicine | Admitting: Emergency Medicine

## 2022-03-13 ENCOUNTER — Other Ambulatory Visit: Payer: Self-pay

## 2022-03-13 DIAGNOSIS — R200 Anesthesia of skin: Secondary | ICD-10-CM | POA: Insufficient documentation

## 2022-03-13 DIAGNOSIS — E1165 Type 2 diabetes mellitus with hyperglycemia: Secondary | ICD-10-CM | POA: Insufficient documentation

## 2022-03-13 DIAGNOSIS — Z7901 Long term (current) use of anticoagulants: Secondary | ICD-10-CM | POA: Insufficient documentation

## 2022-03-13 DIAGNOSIS — H547 Unspecified visual loss: Secondary | ICD-10-CM | POA: Diagnosis not present

## 2022-03-13 DIAGNOSIS — R0602 Shortness of breath: Secondary | ICD-10-CM | POA: Diagnosis not present

## 2022-03-13 DIAGNOSIS — R7989 Other specified abnormal findings of blood chemistry: Secondary | ICD-10-CM | POA: Diagnosis not present

## 2022-03-13 DIAGNOSIS — I480 Paroxysmal atrial fibrillation: Secondary | ICD-10-CM | POA: Insufficient documentation

## 2022-03-13 DIAGNOSIS — I10 Essential (primary) hypertension: Secondary | ICD-10-CM | POA: Insufficient documentation

## 2022-03-13 DIAGNOSIS — R0789 Other chest pain: Secondary | ICD-10-CM | POA: Diagnosis not present

## 2022-03-13 DIAGNOSIS — H5340 Unspecified visual field defects: Secondary | ICD-10-CM

## 2022-03-13 DIAGNOSIS — R2990 Unspecified symptoms and signs involving the nervous system: Secondary | ICD-10-CM | POA: Diagnosis not present

## 2022-03-13 DIAGNOSIS — M79602 Pain in left arm: Secondary | ICD-10-CM | POA: Insufficient documentation

## 2022-03-13 DIAGNOSIS — R079 Chest pain, unspecified: Secondary | ICD-10-CM | POA: Diagnosis present

## 2022-03-13 LAB — CBC
HCT: 41.4 % (ref 36.0–46.0)
Hemoglobin: 12.7 g/dL (ref 12.0–15.0)
MCH: 28.1 pg (ref 26.0–34.0)
MCHC: 30.7 g/dL (ref 30.0–36.0)
MCV: 91.6 fL (ref 80.0–100.0)
Platelets: 198 10*3/uL (ref 150–400)
RBC: 4.52 MIL/uL (ref 3.87–5.11)
RDW: 12.3 % (ref 11.5–15.5)
WBC: 7.3 10*3/uL (ref 4.0–10.5)
nRBC: 0 % (ref 0.0–0.2)

## 2022-03-13 LAB — DIFFERENTIAL
Abs Immature Granulocytes: 0.03 10*3/uL (ref 0.00–0.07)
Basophils Absolute: 0 10*3/uL (ref 0.0–0.1)
Basophils Relative: 1 %
Eosinophils Absolute: 0.2 10*3/uL (ref 0.0–0.5)
Eosinophils Relative: 3 %
Immature Granulocytes: 0 %
Lymphocytes Relative: 19 %
Lymphs Abs: 1.4 10*3/uL (ref 0.7–4.0)
Monocytes Absolute: 0.3 10*3/uL (ref 0.1–1.0)
Monocytes Relative: 4 %
Neutro Abs: 5.3 10*3/uL (ref 1.7–7.7)
Neutrophils Relative %: 73 %

## 2022-03-13 LAB — COMPREHENSIVE METABOLIC PANEL
ALT: 12 U/L (ref 0–44)
AST: 19 U/L (ref 15–41)
Albumin: 3.7 g/dL (ref 3.5–5.0)
Alkaline Phosphatase: 54 U/L (ref 38–126)
Anion gap: 7 (ref 5–15)
BUN: 25 mg/dL — ABNORMAL HIGH (ref 8–23)
CO2: 28 mmol/L (ref 22–32)
Calcium: 8.9 mg/dL (ref 8.9–10.3)
Chloride: 104 mmol/L (ref 98–111)
Creatinine, Ser: 1.54 mg/dL — ABNORMAL HIGH (ref 0.44–1.00)
GFR, Estimated: 34 mL/min — ABNORMAL LOW (ref >60)
Glucose, Bld: 272 mg/dL — ABNORMAL HIGH (ref 70–99)
Potassium: 4.3 mmol/L (ref 3.5–5.1)
Sodium: 139 mmol/L (ref 135–145)
Total Bilirubin: 0.8 mg/dL (ref 0.3–1.2)
Total Protein: 7 g/dL (ref 6.5–8.1)

## 2022-03-13 LAB — TROPONIN I (HIGH SENSITIVITY)
Troponin I (High Sensitivity): 8 ng/L (ref <18)
Troponin I (High Sensitivity): 8 ng/L (ref <18)

## 2022-03-13 LAB — PROTIME-INR
INR: 1.1 (ref 0.8–1.2)
Prothrombin Time: 13.7 seconds (ref 11.4–15.2)

## 2022-03-13 LAB — APTT: aPTT: 30 seconds (ref 24–36)

## 2022-03-13 MED ORDER — SODIUM CHLORIDE 0.9% FLUSH
3.0000 mL | Freq: Once | INTRAVENOUS | Status: AC
  Administered 2022-03-13: 3 mL via INTRAVENOUS

## 2022-03-13 MED ORDER — SODIUM CHLORIDE 0.9 % IV BOLUS
1000.0000 mL | Freq: Once | INTRAVENOUS | Status: AC
  Administered 2022-03-13: 1000 mL via INTRAVENOUS

## 2022-03-13 MED ORDER — IOHEXOL 350 MG/ML SOLN
60.0000 mL | Freq: Once | INTRAVENOUS | Status: AC | PRN
  Administered 2022-03-13: 60 mL via INTRAVENOUS

## 2022-03-13 MED ORDER — PREDNISONE 50 MG PO TABS: 50.0000 mg | ORAL_TABLET | Freq: Every day | ORAL | 0 refills | Status: AC

## 2022-03-13 NOTE — ED Notes (Signed)
Cart in room and button pressed.

## 2022-03-13 NOTE — Consult Note (Signed)
NEUROLOGY CONSULTATION NOTE   Date of service: Mar 13, 2022 Patient Name: April [name] MRN:  [id number] DOB:  10-10-1941 Reason for consult: stroke code Requesting physician: Dr. 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  Last known well was 9:30 AM.  After that she developed a sharp pain in her left arm radiating distally as well as up to her left jaw.  There is no preceding trauma.  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  She says that the visual field deficit mostly but not completely resolved, although today it seems worse than usual.  NIH stroke scale was 1 for visual field deficit.  CT head showed no acute intracranial process, known chronic right occipital infarct.  CNS imaging personally reviewed.  Exam was not consistent with LVO therefore advanced imaging was not performed as part of the stroke code.  Patient is on anticoagulation with Eliquis, last dose this morning.  TNK was not administered due to no definitive new deficits and contraindication of therapeutic anticoagulation.     ROS   Per HPI: all other systems reviewed and are negative  Past History   I have reviewed the following:  Past Medical History:  Diagnosis Date   B12 deficiency    Cataracts, both eyes    Diabetes mellitus without complication ([place])    Dyslipidemia    Hypertension    Past Surgical History:  Procedure Laterality Date   CATARACT EXTRACTION     COLONOSCOPY     COLONOSCOPY WITH PROPOFOL N/A 07/04/2015   Procedure: COLONOSCOPY WITH PROPOFOL;  Surgeon: Josefine Class, MD;  Location: [facility] ENDOSCOPY;  Service: Endoscopy;  Laterality: N/A;    EYE SURGERY     LOOP RECORDER INSERTION N/A 12/03/2018   Procedure: LOOP RECORDER INSERTION;  Surgeon: Isaias Cowman, MD;  Location: [facility] [facility] LAB;  Service: Cardiovascular;  Laterality: N/A;   TEE WITHOUT CARDIOVERSION N/A 12/03/2018   Procedure: TRANSESOPHAGEAL ECHOCARDIOGRAM (TEE);  Surgeon: Teodoro Spray, MD;  Location: [facility] ORS;  Service: Cardiovascular;  Laterality: N/A;   Family History  Problem Relation Age of Onset   Breast cancer Sister 37   Social History   Socioeconomic History   Marital status: Single    Spouse name: Not on file   Number of children: Not on file   Years of education: Not on file   Highest education level: Not on file  Occupational History   Not on file  Tobacco Use   Smoking status: Former   Smokeless tobacco: Never  Vaping Use   Vaping Use: Never used  Substance and Sexual Activity   Alcohol use: No   Drug use: Never   Sexual activity: Not on file  Other Topics Concern   Not on file  Social History Narrative   Not on file   Social Determinants of Health   Financial Resource Strain: Not on file  Food Insecurity: Not on file  Transportation Needs: Not on file  Physical Activity: Not on file  Stress: Not on file  Social Connections: Not  on file   Allergies  Allergen Reactions   Crestor [Rosuvastatin Calcium] Other (See Comments)    Muscle aches/leg pains   Lipitor [Atorvastatin] Other (See Comments)    Muscle aches/leg pains    Liraglutide Nausea Only    Victoza    Sulfa Antibiotics Other (See Comments)    Childhood allergy   Voltaren [Diclofenac Sodium] Nausea Only   Zocor [Simvastatin] Other (See Comments)    Muscle pain    Medications   (Not in a hospital admission)    No current facility-administered medications for this encounter.  Current Outpatient Medications:    apixaban (ELIQUIS) 5 MG TABS tablet, Take 1 tablet by mouth every 12 (twelve) hours., Disp: , Rfl:    cyanocobalamin (,VITAMIN B-12,)  1000 MCG/ML injection, Inject 1,000 mcg into the muscle every 30 (thirty) days., Disp: , Rfl:    ezetimibe (ZETIA) 10 MG tablet, Take 1 tablet by mouth daily., Disp: , Rfl:    glipiZIDE (GLUCOTROL) 10 MG tablet, Take 10 mg by mouth 2 (two) times daily. , Disp: , Rfl:    lisinopril (PRINIVIL,ZESTRIL) 5 MG tablet, Take 5 mg by mouth daily., Disp: , Rfl:    meloxicam (MOBIC) 15 MG tablet, Take 15 mg by mouth daily., Disp: , Rfl:    metFORMIN (GLUCOPHAGE) 500 MG tablet, Take 2 tablets (1,000 mg total) by mouth 2 (two) times daily with a meal., Disp: 120 tablet, Rfl: 1   metoprolol tartrate (LOPRESSOR) 25 MG tablet, Take 1 tablet by mouth 2 (two) times daily., Disp: , Rfl:    pravastatin (PRAVACHOL) 80 MG tablet, Take 80 mg by mouth every evening. , Disp: , Rfl:    sertraline (ZOLOFT) 50 MG tablet, Take 50 mg by mouth daily., Disp: , Rfl:    TRULICITY 7.40 CX/4.4YJ SOPN, SMARTSIG:0.5 Milliliter(s) SUB-Q Once a Week, Disp: , Rfl:   Vitals   Vitals:   03/13/22 1106 03/13/22 1108 03/13/22 1149  BP: (!) 141/63  (!) 149/74  Pulse: 80  78  Resp: 18  20  Temp: 97.8 F (36.6 C)    TempSrc: Oral    SpO2: 93%  100%  Weight:  113.4 kg   Height:  '5\' 2"'$  (1.575 m)      Body mass index is 45.73 kg/m.  Physical Exam   Physical Exam Gen: A&O x4, NAD HEENT: Atraumatic, normocephalic;mucous membranes moist; oropharynx clear, tongue without atrophy or fasciculations. Neck: Supple, trachea midline. Resp: CTAB, no w/r/r CV: RRR, no m/g/r; nml S1 and S2. 2+ symmetric peripheral pulses. Abd: soft/NT/ND; nabs x 4 quad Extrem: Nml bulk; no cyanosis, clubbing, or edema.  Neuro: *MS: A&O x4. Follows multi-step commands.  *Speech: fluid, nondysarthric, able to name and repeat *CN:    I: Deferred   II,III: PERRLA, L homonymous upper quadrantopia, optic discs unable to be visualized 2/2 pupillary constriction   III,IV,VI: EOMI w/o nystagmus, no ptosis   V: Sensation intact from V1 to V3 to LT   VII:  Eyelid closure was full.  Smile symmetric.   VIII: Hearing intact to voice   IX,X: Voice normal, palate elevates symmetrically    XI: SCM/trap 5/5 bilat   XII: Tongue protrudes midline, no atrophy or fasciculations   *Motor:   Normal bulk.  No tremor, rigidity or bradykinesia. No pronator drift.    Strength: Dlt Bic Tri WrE WrF FgS Gr HF KnF KnE PlF DoF    Left '5 5 5 5 5 5 5 5 5 5 5 '$ 5  Right '5 5 5 5 5 5 5 5 5 5 5 5    '$ *Sensory: Intact to light touch, pinprick, temperature vibration throughout. Symmetric. Propioception intact bilat.  No double-simultaneous extinction.  *Coordination:  Finger-to-nose, heel-to-shin, rapid alternating motions were intact. *Reflexes:  2+ and symmetric throughout without clonus; toes down-going bilat *Gait: deferred  NIHSS = 1 for visual fields  Premorbid mRS = 1   Labs   CBC:  Recent Labs  Lab 03/13/22 1116  WBC 7.3  NEUTROABS 5.3  HGB 12.7  HCT 41.4  MCV 91.6  PLT 948    Basic Metabolic Panel:  Lab Results  Component Value Date   NA 137 04/20/2021   K 4.9 04/20/2021   CO2 27 04/20/2021   GLUCOSE 276 (H) 04/20/2021   BUN 24 (H) 04/20/2021   CREATININE 1.14 (H) 04/20/2021   CALCIUM 9.3 04/20/2021   GFRNONAA 49 (L) 04/20/2021   GFRAA >60 01/04/2019   Lipid Panel:  Lab Results  Component Value Date   LDLCALC 62 04/20/2021   HgbA1c:  Lab Results  Component Value Date   HGBA1C 9.9 (H) 04/20/2021   Urine Drug Screen: No results found for: LABOPIA, COCAINSCRNUR, LABBENZ, AMPHETMU, THCU, LABBARB  Alcohol Level No results found for: ETH   Impression   This is a 81 year old woman with history of diabetes type 2, hypertension, hyperlipidemia, prior right occipital stroke who presents with pain in her left arm radiating to her left jaw as well as 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 There were no definitive new deficits on exam, her L homonymous upper quandrantopia localizes to the region of chronic infarct in R  occipital lobe on CT head. Patient is on anticoagulation with Eliquis, last dose this morning.  TNK was not administered due to no definitive new deficits and contraindication of therapeutic anticoagulation. Ddx includes ACS, new ischemic infarct (less likely), or cervical radiculopathy (also less likely given spontaneous with no hx trauma or other inciting factor).  Recommendations   - MRI brain wo contrast - MRI c spine wo contrast - If MRI imaging shows no new significant abnormality, OK to continue eliquis and no additional inpatient neurologic workup indicated - Cardiac workup per ED  I will f/u on MRI results ______________________________________________________________________   Thank you for the opportunity to take part in the care of this patient. If you have any further questions, please contact the neurology consultation attending.  Signed,  Su Monks, MD [facility] [phone]  If 7pm- 7am, please page neurology on call as listed in [place].

## 2022-03-13 NOTE — ED Notes (Addendum)
Tracie RN on screen at this time. All required information given.

## 2022-03-13 NOTE — ED Notes (Signed)
Resumed care from [place] rn.  Pt alert.  No chest pain or sob.  Pt reports pain in left arm/shoulder.  Iv fluids infusing.  Speech clear.  Friend with pt.

## 2022-03-13 NOTE — ED Notes (Signed)
CODE  STROKE  CALLED  TO  [facility]  

## 2022-03-13 NOTE — Progress Notes (Signed)
  Chaplain On-Call responded to Code Stroke notification for ED room 7 at 1117 hours.  While other team members were providing care for the patient, Chaplain received medical update from Dr. Quinn Axe.  The plan is for the patient to receive an MRI examination soon.  Chaplains are available for further support if requested.  Chaplain Pollyann Samples M.Div., [facility]

## 2022-03-13 NOTE — ED Notes (Signed)
Patient to x-ray at his time.

## 2022-03-13 NOTE — ED Notes (Signed)
Patient at MRI 

## 2022-03-13 NOTE — ED Triage Notes (Addendum)
Pt c/o intermittent sharp left arm pain that radiates into the neck and between the shoulder blades since around 9am, states the pain takes her breath away. Pt is in NAD on arrival.. c/o loss of left  eye peripheral vision today, states she has a hx of the same in the past.that comes and goes. C/o "left arm feeling heavy/funny"

## 2022-03-13 NOTE — Discharge Instructions (Addendum)
-  Follow-up with the neurosurgeon listed above, as discussed.  -Take all the steroids as prescribed.  You may additionally take Tylenol/ibuprofen as needed  -Return to the emergency department anytime if you begin to experience any new or worsening symptoms.

## 2022-03-13 NOTE — ED Provider Notes (Signed)
[facility] Provider Note    Event Date/Time   First MD Initiated Contact with Patient 03/13/22 1125     (approximate)   History   Chief Complaint Chest Pain and Visual Field Change   HPI April [name] is a 81 y.o. female, history of CVA, obesity, hypertension, hyperlipidemia, paroxysmal atrial fibrillation, type 2 diabetes, presents to the emergency department for evaluation of chest/left arm pain.  Patient states that she has been feeling weak and unwell for the past 2 to 3 days.  When she was in the car this morning, she felt a sudden onset of pain in her left scapular region radiating into her left arm and terminating at the left elbow.  Describes as a dull achy sensation with intermittent sharp pains.  Reports some numbness into her left hand as well.  Reports mild shortness of breath as well.  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  Denies any recent falls or injuries denies fever/chills, flank pain, nausea/vomiting, diarrhea, dysuria, hearing changes, numbness/tingling in lower extremities, or dizziness/lightheadedness.  She is currently on Eliquis  History Limitations: No limitations        Physical Exam  Triage Vital Signs: ED Triage Vitals  Enc Vitals Group     BP 03/13/22 1106 (!) 141/63     Pulse Rate 03/13/22 1106 80     Resp 03/13/22 1106 18     Temp 03/13/22 1106 97.8 F (36.6 C)     Temp Source 03/13/22 1106 Oral     SpO2 03/13/22 1106 93 %     Weight 03/13/22 1108 250 lb (113.4 kg)     Height 03/13/22 1108 '5\' 2"'$  (1.575 m)     Head Circumference --      Peak Flow --      Pain Score --      Pain Loc --      Pain Edu? --      Excl. in [place]? --     Most recent vital signs: Vitals:   03/13/22 1545 03/13/22 1600  BP:  (!) 133/50  Pulse: 81 83  Resp: 17 18  Temp:    SpO2: 100% 99%     General: Awake, NAD.  Skin: Warm, dry. No rashes or lesions.  Eyes: PERRL. Conjunctivae normal.  CV: Good peripheral perfusion.  Resp: Normal effort.  Mild wheezing appreciated bilaterally.  Abd: Soft, non-tender. No distention.  Neuro: At baseline. No gross neurological deficits.  Cranial nerves II through XII intact.  5/5 strength in upper and lower extremities.  Sensation intact.  Focused Exam: No gross deformities to the chest, neck, or left upper extremity.  Normal range of motion.  No midline cervical spine tenderness.  Physical Exam    ED Results / Procedures / Treatments  Labs (all labs ordered are listed, but only abnormal results are displayed) Labs Reviewed  COMPREHENSIVE METABOLIC PANEL - Abnormal; Notable for the following components:      Result Value   Glucose, Bld 272 (*)    BUN 25 (*)    Creatinine, Ser 1.54 (*)    GFR, Estimated 34 (*)    All other components within normal limits  PROTIME-INR  APTT  CBC  DIFFERENTIAL  CBG MONITORING, ED  TROPONIN I (HIGH SENSITIVITY)  TROPONIN I (HIGH SENSITIVITY)     EKG Sinus  rhythm, rate of 78, LBBB (consistent with last EKG), no new ST segment changes, QT 428, PR interval 160.   RADIOLOGY  ED Provider Interpretation: I personally viewed and interpreted these images.  CT angio head/neck shows no acute abnormalities.  Chest x-ray shows no acute abnormalities.  MRI brain shows no acute abnormalities.  MRI cervical spine shows bulging disc at C6/7.   CT ANGIO HEAD NECK W WO CM  Result Date: 03/13/2022 CLINICAL DATA:  Stroke/TIA, determine embolic source. Left arm pain. EXAM: CT ANGIOGRAPHY HEAD AND NECK TECHNIQUE: Multidetector CT imaging of the head and neck was performed using the standard protocol during bolus administration of intravenous contrast. Multiplanar CT image reconstructions and MIPs were obtained to evaluate the vascular anatomy. Carotid stenosis measurements (when applicable) are obtained utilizing  NASCET criteria, using the distal internal carotid diameter as the denominator. RADIATION DOSE REDUCTION: This exam was performed according to the departmental dose-optimization program which includes automated exposure control, adjustment of the mA and/or kV according to patient size and/or use of iterative reconstruction technique. CONTRAST:  53m OMNIPAQUE IOHEXOL 350 MG/ML SOLN COMPARISON:  Head CT earlier same day.  CT angiography 09/23/2018 FINDINGS: CTA NECK FINDINGS Aortic arch: Aortic atherosclerosis, mild. Branching pattern is normal without flow limiting origin stenosis. Right carotid system: Common carotid artery widely patent to the bifurcation. Carotid bifurcation is normal. No stenosis. Cervical ICA is tortuous but widely patent. Left carotid system: Common carotid artery widely patent to the bifurcation. Carotid bifurcation is normal. Cervical ICA is tortuous but widely patent. Vertebral arteries: Both vertebral artery origins are widely patent. Both vertebral arteries are approximately equal in size an widely patent through the cervical region to the foramen magnum. Skeleton: Previous ACDF C5-6. Other neck: Redemonstration of a peripherally calcified 15 mm thyroid nodule on the left, unchanged since 2019 and previously evaluated by ultrasound. Upper chest: No active process. Review of the MIP images confirms the above findings CTA HEAD FINDINGS Anterior circulation: Both internal carotid arteries are patent through the skull base and siphon regions. There is ordinary siphon atherosclerotic calcification but no stenosis greater than 30%. The anterior and middle cerebral vessels are patent. No large vessel occlusion. No proximal stenosis. No aneurysm or vascular malformation. Posterior circulation: Both vertebral arteries are widely patent to the basilar. Both posteroinferior cerebellar arteries show flow. No basilar stenosis. Both superior cerebellar and posterior cerebral arteries show flow. 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 No new insult. Venous sinuses: Patent and normal. Anatomic variants: None significant. Review of the MIP images confirms the above findings IMPRESSION: No change since the study of 2019. Mild aortic atherosclerosis. Carotid bifurcation regions in vertebral artery origins are widely patent. No intracranial anterior circulation pathology of significance. No LV 0. 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 No new posterior circulation insult. Electronically Signed   By: MNelson ChimesM.D.   On: 03/13/2022 13:06   DG Chest 2 View  Result Date: 03/13/2022 CLINICAL DATA:  Shortness of breath. EXAM: CHEST - 2 VIEW COMPARISON:  Chest x-ray dated April 20, 2021. FINDINGS: Unchanged loop recorder. The heart size and mediastinal contours are within normal limits. Normal pulmonary vascularity. No focal consolidation, pleural effusion, or pneumothorax. Unchanged calcified granuloma in the right middle lobe. No acute osseous abnormality. IMPRESSION: 1. No acute cardiopulmonary disease. Electronically Signed   By: WTitus DubinM.D.   On: 03/13/2022 12:51   MR BRAIN WO  CONTRAST  Result Date: 03/13/2022 CLINICAL DATA:  Neuro deficit, acute, stroke suspected. Diabetes, hypertension and hyperlipidemia. EXAM: MRI HEAD WITHOUT CONTRAST TECHNIQUE: Multiplanar, multiecho pulse sequences of the brain and surrounding structures were obtained without intravenous contrast. COMPARISON:  CT angiography same day FINDINGS: Brain: Diffusion imaging does not show any acute or subacute infarction. There is generalized brain volume loss. Chronic small-vessel ischemic changes affect the pons and cerebellum. Left cerebral hemisphere shows chronic small-vessel ischemic change of the white matter. Right hemisphere shows chronic small-vessel ischemic change of the white matter with old cortical and subcortical infarctions in the right  frontal operculum and in the left occipital lobe. No evidence of mass, hemorrhage, hydrocephalus or extra-axial collection. Vascular: Major vessels at the base of the brain show flow. Skull and upper cervical spine: Negative Sinuses/Orbits: Clear/normal Other: None IMPRESSION: No acute MRI finding. Chronic small-vessel ischemic changes affecting the pons, cerebellum and cerebral hemispheric white matter. Old cortical and subcortical infarctions in the right frontal operculum and left occipital lobe. Electronically Signed   By: Nelson Chimes M.D.   On: 03/13/2022 14:42   MR Cervical Spine Wo Contrast  Result Date: 03/13/2022 CLINICAL DATA:  Myelopathy, acute, cervical spine. Pain in the left arm EXAM: MRI CERVICAL SPINE WITHOUT CONTRAST TECHNIQUE: Multiplanar, multisequence MR imaging of the cervical spine was performed. No intravenous contrast was administered. COMPARISON:  Radiography 07/12/2011.  MRI 03/23/2011 FINDINGS: Alignment: Straightening and slight kyphotic curvature. Vertebrae: Distant ACDF C5-6.  Otherwise negative. Cord: No primary cord lesion.  See below regarding stenosis. Posterior Fossa, vertebral arteries, paraspinal tissues: Negative Disc levels: Foramen magnum, C1-2 and C2-3 are normal. C3-4: Bulging of the disc. Facet degeneration on the left. AP diameter of the canal in the midline 7.5 mm. Effacement of the subarachnoid space without frank cord compression. Left foraminal stenosis that could compress the left C4 nerve. C4-5: Endplate osteophytes and bulging of the disc. Canal narrowing with AP diameter in the midline 8 mm. Narrowing of the subarachnoid space but no compression of the cord. No compressive foraminal narrowing. C5-6: Previous ACDF has a good appearance. Sufficient patency of the canal and foramina. C6-7: Bulging of the disc. Narrowing of the ventral subarachnoid space. AP diameter of the canal in the midline 9 mm. No foraminal stenosis. C7-T1: Minimal bulging of the disc. Facet  degeneration on the left. Mild left foraminal narrowing without visible compression of the exiting C8 nerve. IMPRESSION: Previous ACDF C5-6 has a good appearance with wide patency of the canal and foramina at that level. 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 Canal narrowing with AP diameter in the midline 7.5 mm. Effacement of the subarachnoid space but no frank compression of the cord. Left foraminal stenosis that could compress the left C4 nerve. C4-5 spondylosis with canal narrowing, AP diameter in the midline 8 mm. No cord compression or foraminal stenosis. C6-7: Disc bulge without compressive canal or foraminal stenosis. C7-T1: Mild left foraminal stenosis due to encroachment by facet osteophytes but without gross compression of the left C8 nerve. Electronically Signed   By: Nelson Chimes M.D.   On: 03/13/2022 14:46   CT HEAD CODE STROKE WO CONTRAST  Result Date: 03/13/2022 CLINICAL DATA:  Code stroke. Acute neuro deficit. Right sided visual change EXAM: CT HEAD WITHOUT CONTRAST TECHNIQUE: Contiguous axial images were obtained from the base of the skull through the vertex without intravenous contrast. RADIATION DOSE REDUCTION: This exam was performed according to the departmental dose-optimization program which includes automated exposure control, adjustment of  the mA and/or kV according to patient size and/or use of iterative reconstruction technique. COMPARISON:  CT head 04/10/2021 FINDINGS: Brain: Negative for acute infarct, hemorrhage, mass. Negative for hydrocephalus Chronic infarct in the right frontal lobe and right occipital lobe unchanged. Chronic microvascular ischemic changes in the white matter. Small chronic infarct right cerebellum. Vascular: Negative for hyperdense vessel Skull: Negative Sinuses/Orbits: Paranasal sinuses clear.  No orbital lesion Other: None ASPECTS ([place] Stroke Program Early CT Score) - Ganglionic level infarction (caudate, lentiform nuclei,  internal capsule, insula, M1-M3 cortex): 7 - Supraganglionic infarction (M4-M6 cortex): 3 Total score (0-10 with 10 being normal): 10 IMPRESSION: 1. No acute intracranial abnormality. Chronic ischemic changes as above. 2. ASPECTS is 10 3. Code stroke imaging results were communicated on 03/13/2022 at 11:30 am to provider Quinn Axe via amnion text page Electronically Signed   By: Franchot Gallo M.D.   On: 03/13/2022 11:32    PROCEDURES:  .Critical Care Performed by: Teodoro Spray, PA Authorized by: Teodoro Spray, PA   Critical care provider statement:    Critical care time (minutes):  30   Critical care was time spent personally by me on the following activities:  Development of treatment plan with patient or surrogate, evaluation of patient's response to treatment, examination of patient, ordering and review of laboratory studies, ordering and review of radiographic studies, ordering and performing treatments and interventions, pulse oximetry, re-evaluation of patient's condition, review of old charts and discussions with consultants    MEDICATIONS ORDERED IN ED: Medications  sodium chloride flush (NS) 0.9 % injection 3 mL (3 mLs Intravenous Given 03/13/22 1143)  sodium chloride 0.9 % bolus 1,000 mL (0 mLs Intravenous Stopped 03/13/22 1532)  iohexol (OMNIPAQUE) 350 MG/ML injection 60 mL (60 mLs Intravenous Contrast Given 03/13/22 1249)     IMPRESSION / MDM / [place] / ED COURSE  I reviewed the triage vital signs and the nursing notes.                              Differential diagnosis includes, but is not limited to, TIA, ischemic stroke, hemorrhagic stroke, cervical radiculopathy, aortic dissection, ACS.  ED Course Well, vitals within normal limits.  NAD.  Not requesting any pain medication at this time.  CBC shows no leukocytosis or anemia.  CMP notable for hyperglycemia to 72, consistent with history of diabetes.  Creatinine elevated 1.54 from 1.14  approximately 10 months prior.  Initial troponin 8.  Second troponin 8.  Unlikely ACS or myocarditis.  Pro time INR unremarkable at 1.1.  Assessment/Plan Patient presents with chest/left arm pain started this morning.  Cardiac work-up has been unremarkable.  EKG and troponin reassuring.  No evidence of acute stroke on MRI brain.  Patient clinically appears very well.  No obvious neurological deficits.  Cervical spine MRI shows similar findings, including foraminal stenosis along C4 that could be causing radicular symptoms.  Additionally shows disc bulging at C6-7 without compressive canal or foraminal stenosis.  Mild left foraminal stenosis patient is well, without gross compression of the left C8 nerve.  I suspect that her symptoms are likely related to these findings.  We will provide her with a referral to neurosurgery and a prescription for short-term prednisone for symptomatic relief.  In regards to her left-sided visual field deficit, she states it is gradually resolving with time.  Unclear etiology at this point, though her MRI brain was unremarkable and I am reassured  by her improving symptoms.   Patient is asymptomatic at this time.  Currently stable.  We will plan to discharge with primary care and neurosurgery follow-up.  Considered admission for this patient, but given her stable presentation and unremarkable work-up, she is unlikely to benefit from admission.  Provided the patient with anticipatory guidance, return precautions, and educational material. Encouraged the patient to return to the emergency department at any time if they begin to experience any new or worsening symptoms. Patient expressed understanding and agreed with the plan.       FINAL CLINICAL IMPRESSION(S) / ED DIAGNOSES   Final diagnoses:  Atypical chest pain  Left arm pain     Rx / DC Orders   ED Discharge Orders          Ordered    predniSONE (DELTASONE) 50 MG tablet  Daily with breakfast         03/13/22 1540             Note:  This document was prepared using Dragon voice recognition software and may include unintentional dictation errors.   Teodoro Spray, [place] 03/13/22 1751    Duffy Bruce, MD 03/24/22 0900

## 2022-05-31 IMAGING — MR MR CERVICAL SPINE W/O CM
5 series · 38 of 48 positions shown · non-contrast
Comparison: Radiography 07/12/2011.  MRI 03/23/2011

CLINICAL DATA: Myelopathy, acute, cervical spine. Pain in the left
arm

EXAM:
MRI CERVICAL SPINE WITHOUT CONTRAST
TECHNIQUE: Multiplanar, multisequence MR imaging of the cervical spine was
performed. No intravenous contrast was administered.

[Series 5: T2 · sagittal · 3.0mm · 0.62mm/px · 6 of 15 slices shown (1 of 2)]
[im 1/15]
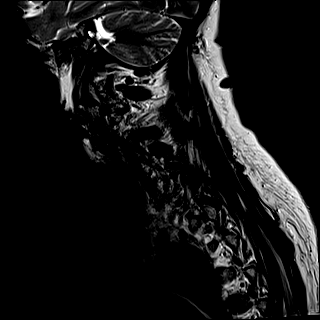
[im 3/15]
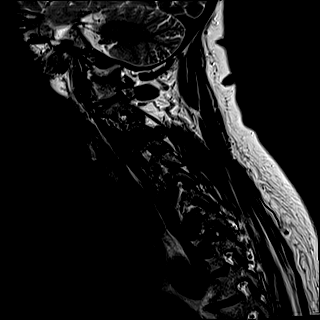
[im 6/15]
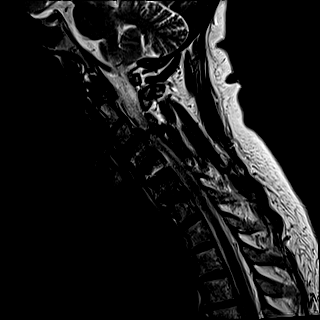
[im 9/15]
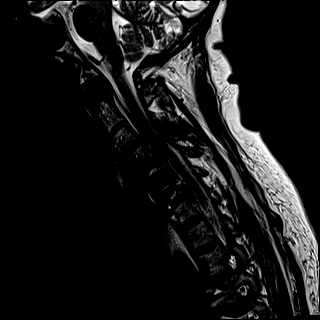
[im 12/15]
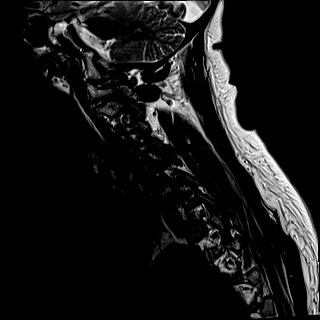
[im 15/15]
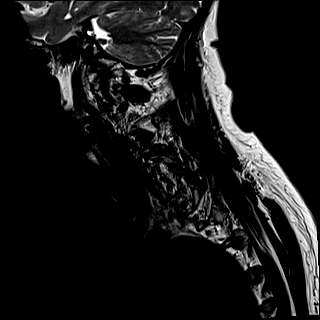

[Series 6: FLAIR · sagittal · 3.0mm · 0.78mm/px · 7 of 15 slices shown]
[im 1/15]
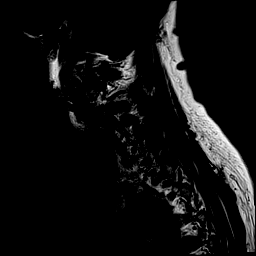
[im 3/15]
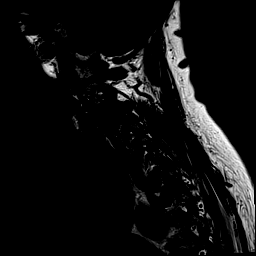
[im 5/15]
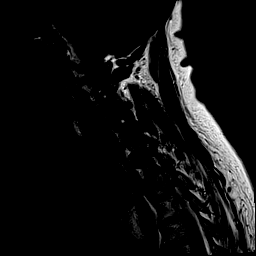
[im 8/15]
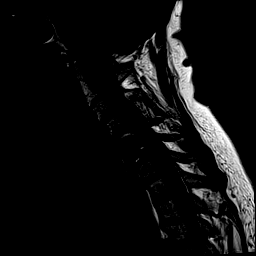
[im 10/15]
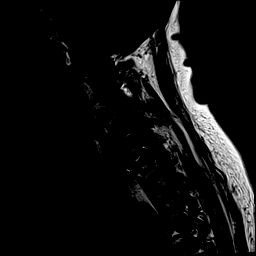
[im 12/15]
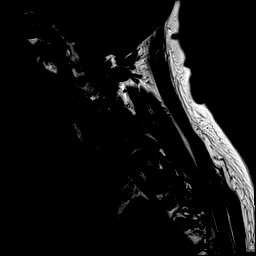
[im 15/15]
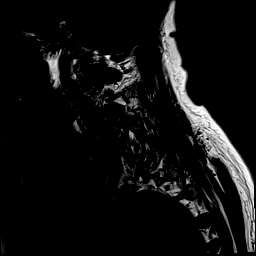

[Series 7: STIR · sagittal · 3.0mm · 0.62mm/px · 7 of 15 slices shown]
[im 1/15]
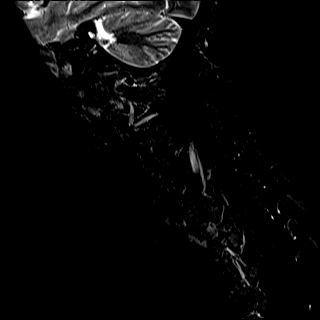
[im 3/15]
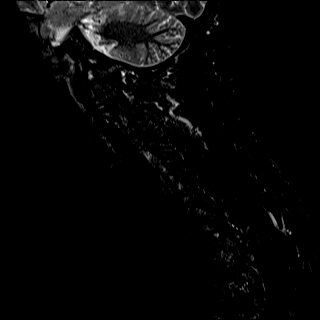
[im 5/15]
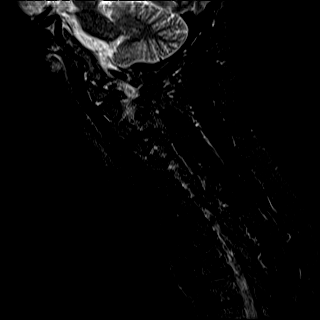
[im 8/15]
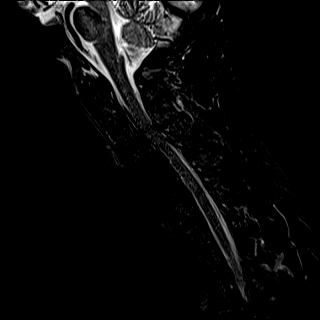
[im 10/15]
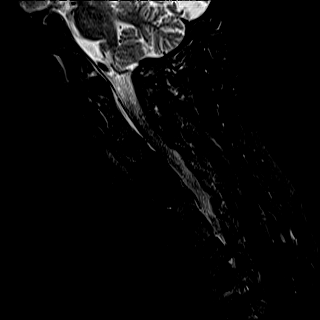
[im 12/15]
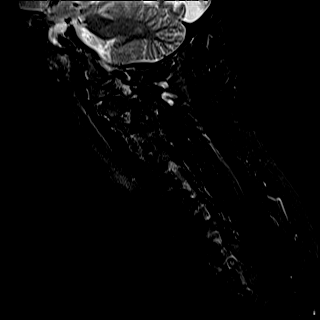
[im 15/15]
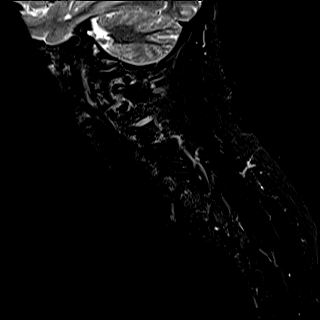

[Series 8: T2 · axial · 3.0mm · 0.70mm/px · z∈[-234,-151]mm · 10 of 29 slices shown (2 of 2)]
[im 1/29]
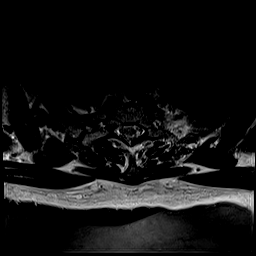
[im 3/29]
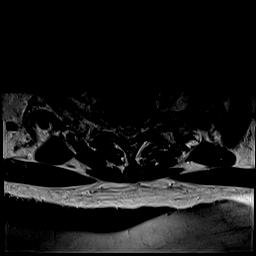
[im 5/29]
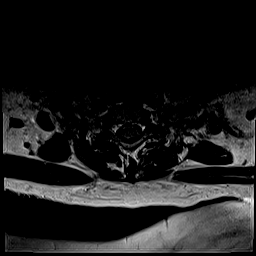
[im 7/29]
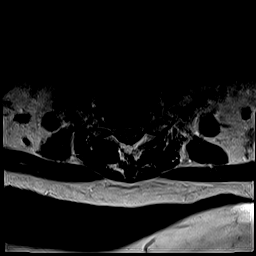
[im 9/29]
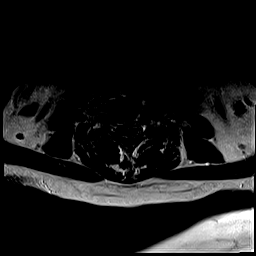
[im 13/29]
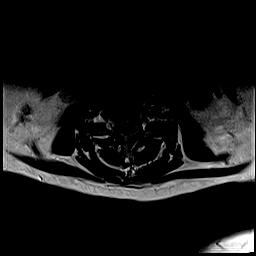
[im 16/29]
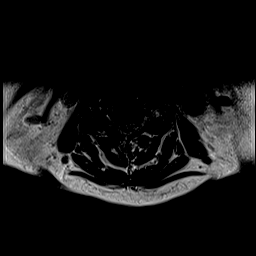
[im 20/29]
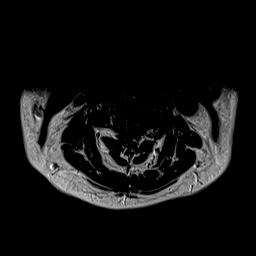
[im 24/29]
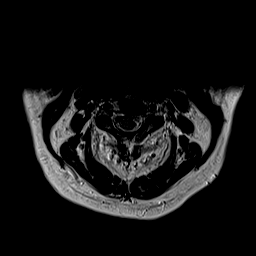
[im 29/29]
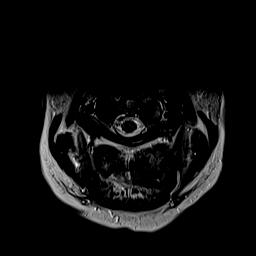

[Series 9: ax mpgr · axial · 3.0mm · 0.35mm/px · z∈[-234,-151]mm · 8 of 29 slices shown]
[im 1/29]
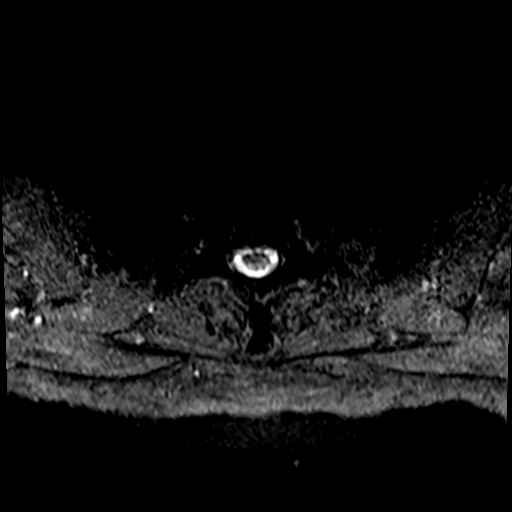
[im 5/29]
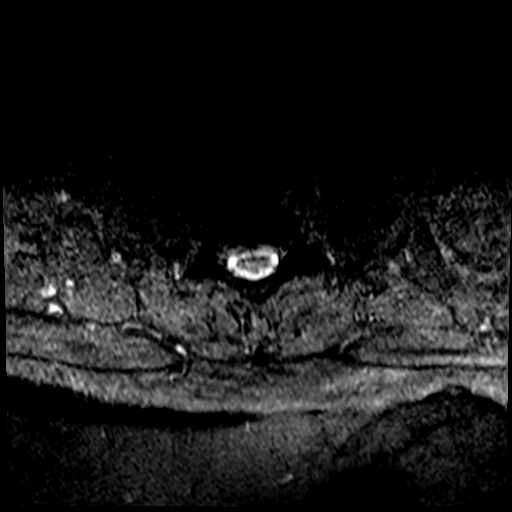
[im 9/29]
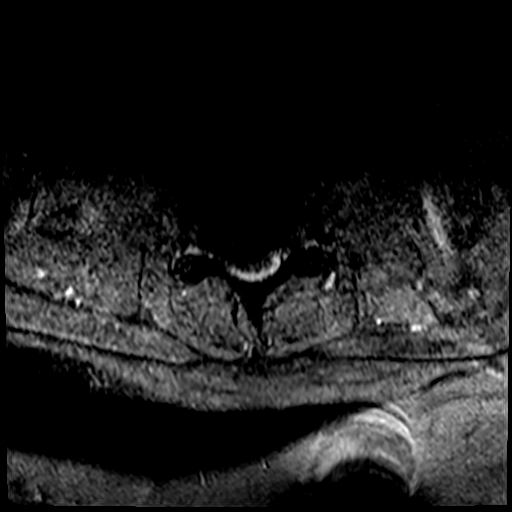
[im 13/29]
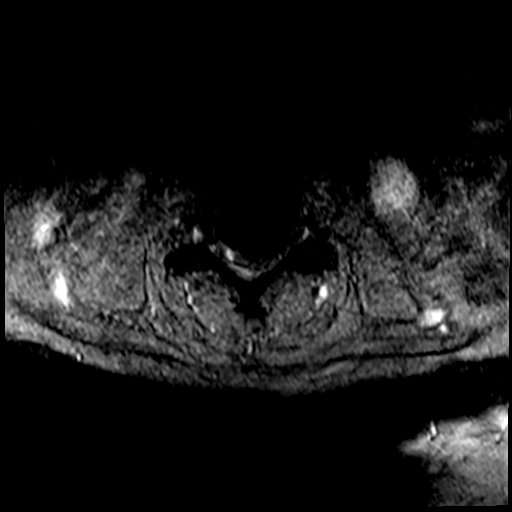
[im 16/29]
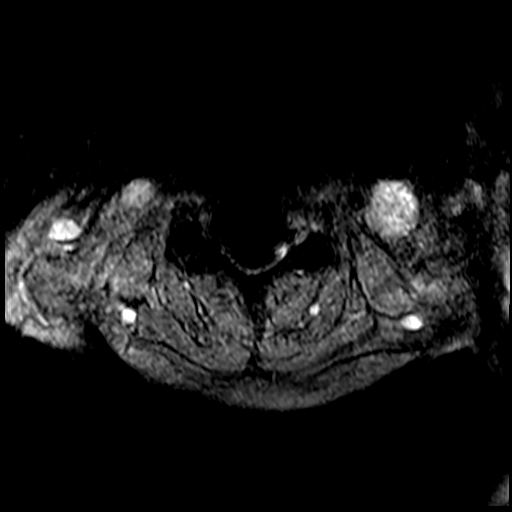
[im 20/29]
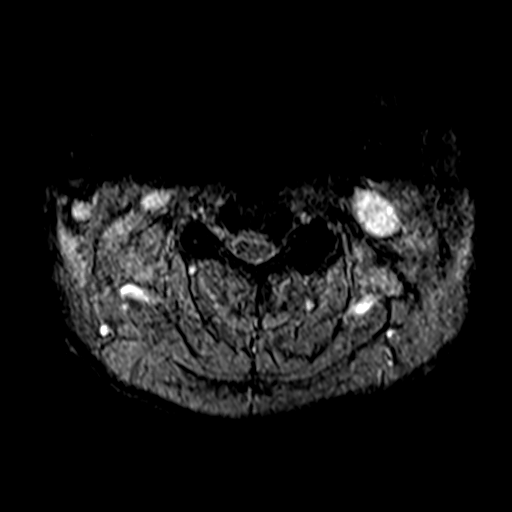
[im 24/29]
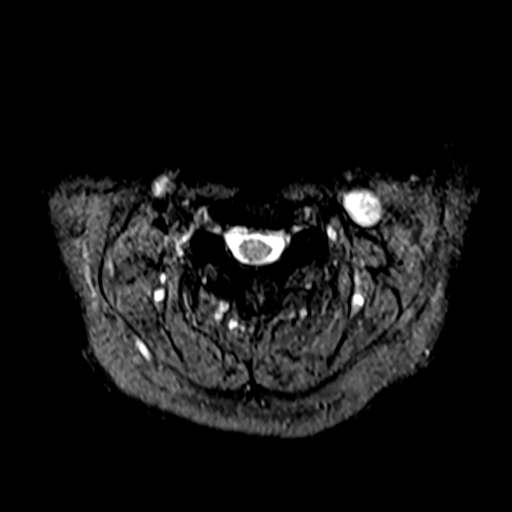
[im 29/29]
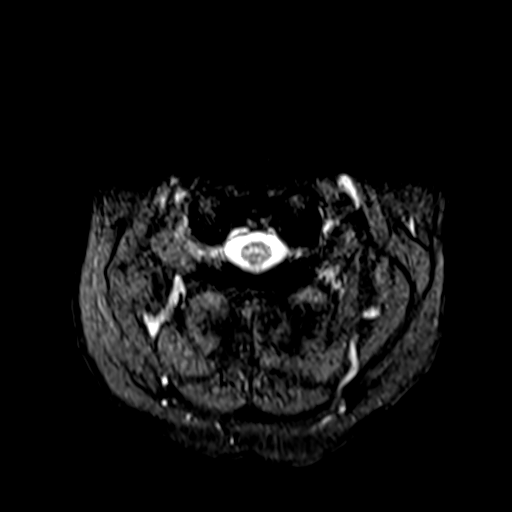

[38 of 48 positions shown; findings below may reference images not displayed]

FINDINGS: Alignment: Straightening and slight kyphotic curvature.

Vertebrae: Distant ACDF C5-6.  Otherwise negative.

Cord: No primary cord lesion.  See below regarding stenosis.

Posterior Fossa, vertebral arteries, paraspinal tissues: Negative

Disc levels:

Foramen magnum, C1-2 and C2-3 are normal.

C3-4: Bulging of the disc. Facet degeneration on the left. AP
diameter of the canal in the midline 7.5 mm. Effacement of the
subarachnoid space without frank cord compression. Left foraminal
stenosis that could compress the left C4 nerve.

C4-5: Endplate osteophytes and bulging of the disc. Canal narrowing
with AP diameter in the midline 8 mm. Narrowing of the subarachnoid
space but no compression of the cord. No compressive foraminal
narrowing.

C5-6: Previous ACDF has a good appearance. Sufficient patency of the
canal and foramina.

C6-7: Bulging of the disc. Narrowing of the ventral subarachnoid
space. AP diameter of the canal in the midline 9 mm. No foraminal
stenosis.

C7-T1: Minimal bulging of the disc. Facet degeneration on the left.
Mild left foraminal narrowing without visible compression of the
exiting C8 nerve.
IMPRESSION: Previous ACDF C5-6 has a good appearance with wide patency of the
canal and foramina at that level.

C3-4 degenerative spondylosis and left-sided facet arthropathy.
Canal narrowing with AP diameter in the midline 7.5 mm. Effacement
of the subarachnoid space but no frank compression of the cord. Left
foraminal stenosis that could compress the left C4 nerve.

C4-5 spondylosis with canal narrowing, AP diameter in the midline 8
mm. No cord compression or foraminal stenosis.

C6-7: Disc bulge without compressive canal or foraminal stenosis.

C7-T1: Mild left foraminal stenosis due to encroachment by facet
osteophytes but without gross compression of the left C8 nerve.
